# Patient Record
Sex: Male | Born: 1935 | Race: White | Hispanic: No | Marital: Married | Smoking: Former smoker
Health system: Southern US, Community
[De-identification: ages and names within clinical notes are randomized; demographics above are authoritative.]

## PROBLEM LIST (undated history)

## (undated) DIAGNOSIS — IMO0001 Reserved for inherently not codable concepts without codable children: Secondary | ICD-10-CM

## (undated) DIAGNOSIS — I2699 Other pulmonary embolism without acute cor pulmonale: Secondary | ICD-10-CM

## (undated) DIAGNOSIS — K219 Gastro-esophageal reflux disease without esophagitis: Secondary | ICD-10-CM

## (undated) DIAGNOSIS — E7800 Pure hypercholesterolemia, unspecified: Secondary | ICD-10-CM

---

## 2005-05-05 ENCOUNTER — Ambulatory Visit (HOSPITAL_COMMUNITY): Admission: RE | Admit: 2005-05-05 | Discharge: 2005-05-05 | Payer: Self-pay | Admitting: Family Medicine

## 2014-03-29 DIAGNOSIS — Z125 Encounter for screening for malignant neoplasm of prostate: Secondary | ICD-10-CM | POA: Diagnosis not present

## 2014-03-29 DIAGNOSIS — E785 Hyperlipidemia, unspecified: Secondary | ICD-10-CM | POA: Diagnosis not present

## 2014-03-29 DIAGNOSIS — Z0000 Encounter for general adult medical examination without abnormal findings: Secondary | ICD-10-CM | POA: Diagnosis not present

## 2014-03-29 DIAGNOSIS — Z79899 Other long term (current) drug therapy: Secondary | ICD-10-CM | POA: Diagnosis not present

## 2014-08-13 DIAGNOSIS — D235 Other benign neoplasm of skin of trunk: Secondary | ICD-10-CM | POA: Diagnosis not present

## 2014-08-13 DIAGNOSIS — L820 Inflamed seborrheic keratosis: Secondary | ICD-10-CM | POA: Diagnosis not present

## 2014-08-13 DIAGNOSIS — L570 Actinic keratosis: Secondary | ICD-10-CM | POA: Diagnosis not present

## 2014-10-03 DIAGNOSIS — Z23 Encounter for immunization: Secondary | ICD-10-CM | POA: Diagnosis not present

## 2014-10-10 DIAGNOSIS — Z23 Encounter for immunization: Secondary | ICD-10-CM | POA: Diagnosis not present

## 2014-10-10 DIAGNOSIS — E785 Hyperlipidemia, unspecified: Secondary | ICD-10-CM | POA: Diagnosis not present

## 2015-04-04 DIAGNOSIS — Z79899 Other long term (current) drug therapy: Secondary | ICD-10-CM | POA: Diagnosis not present

## 2015-04-04 DIAGNOSIS — E785 Hyperlipidemia, unspecified: Secondary | ICD-10-CM | POA: Diagnosis not present

## 2015-04-04 DIAGNOSIS — Z125 Encounter for screening for malignant neoplasm of prostate: Secondary | ICD-10-CM | POA: Diagnosis not present

## 2015-04-04 DIAGNOSIS — Z0000 Encounter for general adult medical examination without abnormal findings: Secondary | ICD-10-CM | POA: Diagnosis not present

## 2015-10-01 DIAGNOSIS — Z23 Encounter for immunization: Secondary | ICD-10-CM | POA: Diagnosis not present

## 2015-10-11 DIAGNOSIS — E785 Hyperlipidemia, unspecified: Secondary | ICD-10-CM | POA: Diagnosis not present

## 2015-10-11 DIAGNOSIS — Z79899 Other long term (current) drug therapy: Secondary | ICD-10-CM | POA: Diagnosis not present

## 2015-12-17 ENCOUNTER — Emergency Department (HOSPITAL_COMMUNITY): Payer: Medicare Other

## 2015-12-17 ENCOUNTER — Observation Stay (HOSPITAL_BASED_OUTPATIENT_CLINIC_OR_DEPARTMENT_OTHER): Payer: Medicare Other

## 2015-12-17 ENCOUNTER — Encounter (HOSPITAL_COMMUNITY): Payer: Self-pay | Admitting: *Deleted

## 2015-12-17 ENCOUNTER — Observation Stay (HOSPITAL_COMMUNITY): Payer: Medicare Other

## 2015-12-17 ENCOUNTER — Inpatient Hospital Stay (HOSPITAL_COMMUNITY)
Admission: EM | Admit: 2015-12-17 | Discharge: 2015-12-23 | DRG: 176 | Disposition: A | Discharge status: Home / Self Care | Payer: Medicare Other | Attending: Family Medicine | Admitting: Family Medicine

## 2015-12-17 DIAGNOSIS — I10 Essential (primary) hypertension: Secondary | ICD-10-CM | POA: Diagnosis present

## 2015-12-17 DIAGNOSIS — E860 Dehydration: Secondary | ICD-10-CM | POA: Diagnosis not present

## 2015-12-17 DIAGNOSIS — R0600 Dyspnea, unspecified: Secondary | ICD-10-CM

## 2015-12-17 DIAGNOSIS — R0789 Other chest pain: Secondary | ICD-10-CM | POA: Diagnosis not present

## 2015-12-17 DIAGNOSIS — I2601 Septic pulmonary embolism with acute cor pulmonale: Secondary | ICD-10-CM

## 2015-12-17 DIAGNOSIS — Z880 Allergy status to penicillin: Secondary | ICD-10-CM

## 2015-12-17 DIAGNOSIS — E785 Hyperlipidemia, unspecified: Secondary | ICD-10-CM | POA: Diagnosis not present

## 2015-12-17 DIAGNOSIS — K2100 Gastro-esophageal reflux disease with esophagitis, without bleeding: Secondary | ICD-10-CM

## 2015-12-17 DIAGNOSIS — I82402 Acute embolism and thrombosis of unspecified deep veins of left lower extremity: Secondary | ICD-10-CM | POA: Diagnosis not present

## 2015-12-17 DIAGNOSIS — Z87891 Personal history of nicotine dependence: Secondary | ICD-10-CM | POA: Diagnosis not present

## 2015-12-17 DIAGNOSIS — I2699 Other pulmonary embolism without acute cor pulmonale: Secondary | ICD-10-CM | POA: Diagnosis not present

## 2015-12-17 DIAGNOSIS — N289 Disorder of kidney and ureter, unspecified: Secondary | ICD-10-CM

## 2015-12-17 DIAGNOSIS — R0602 Shortness of breath: Secondary | ICD-10-CM | POA: Diagnosis present

## 2015-12-17 DIAGNOSIS — R079 Chest pain, unspecified: Secondary | ICD-10-CM | POA: Diagnosis not present

## 2015-12-17 DIAGNOSIS — Z91030 Bee allergy status: Secondary | ICD-10-CM

## 2015-12-17 DIAGNOSIS — I2692 Saddle embolus of pulmonary artery without acute cor pulmonale: Secondary | ICD-10-CM | POA: Diagnosis not present

## 2015-12-17 DIAGNOSIS — K219 Gastro-esophageal reflux disease without esophagitis: Secondary | ICD-10-CM | POA: Diagnosis present

## 2015-12-17 DIAGNOSIS — R55 Syncope and collapse: Secondary | ICD-10-CM

## 2015-12-17 DIAGNOSIS — S0990XA Unspecified injury of head, initial encounter: Secondary | ICD-10-CM

## 2015-12-17 DIAGNOSIS — E7800 Pure hypercholesterolemia, unspecified: Secondary | ICD-10-CM | POA: Diagnosis present

## 2015-12-17 HISTORY — DX: Gastro-esophageal reflux disease without esophagitis: K21.9

## 2015-12-17 HISTORY — DX: Reserved for inherently not codable concepts without codable children: IMO0001

## 2015-12-17 HISTORY — DX: Pure hypercholesterolemia, unspecified: E78.00

## 2015-12-17 LAB — COMPREHENSIVE METABOLIC PANEL
ALBUMIN: 3.3 g/dL — AB (ref 3.5–5.0)
ALK PHOS: 66 U/L (ref 38–126)
ALT: 19 U/L (ref 17–63)
ANION GAP: 9 (ref 5–15)
AST: 21 U/L (ref 15–41)
BUN: 19 mg/dL (ref 6–20)
CALCIUM: 9.1 mg/dL (ref 8.9–10.3)
CHLORIDE: 105 mmol/L (ref 101–111)
CO2: 24 mmol/L (ref 22–32)
Creatinine, Ser: 1.4 mg/dL — ABNORMAL HIGH (ref 0.61–1.24)
GFR calc Af Amer: 54 mL/min — ABNORMAL LOW (ref >60)
GFR calc non Af Amer: 47 mL/min — ABNORMAL LOW (ref >60)
GLUCOSE: 171 mg/dL — AB (ref 65–99)
POTASSIUM: 3.9 mmol/L (ref 3.5–5.1)
SODIUM: 138 mmol/L (ref 135–145)
Total Bilirubin: 0.6 mg/dL (ref 0.3–1.2)
Total Protein: 7.2 g/dL (ref 6.5–8.1)

## 2015-12-17 LAB — CBC WITH DIFFERENTIAL/PLATELET
BASOS PCT: 0 %
Basophils Absolute: 0 10*3/uL (ref 0.0–0.1)
EOS ABS: 0.3 10*3/uL (ref 0.0–0.7)
EOS PCT: 3 %
HCT: 41.9 % (ref 39.0–52.0)
HEMOGLOBIN: 14.2 g/dL (ref 13.0–17.0)
Lymphocytes Relative: 17 %
Lymphs Abs: 2 10*3/uL (ref 0.7–4.0)
MCH: 30.6 pg (ref 26.0–34.0)
MCHC: 33.9 g/dL (ref 30.0–36.0)
MCV: 90.3 fL (ref 78.0–100.0)
MONOS PCT: 5 %
Monocytes Absolute: 0.6 10*3/uL (ref 0.1–1.0)
NEUTROS PCT: 75 %
Neutro Abs: 8.7 10*3/uL — ABNORMAL HIGH (ref 1.7–7.7)
PLATELETS: 144 10*3/uL — AB (ref 150–400)
RBC: 4.64 MIL/uL (ref 4.22–5.81)
RDW: 12.9 % (ref 11.5–15.5)
WBC: 11.6 10*3/uL — AB (ref 4.0–10.5)

## 2015-12-17 LAB — URINE MICROSCOPIC-ADD ON

## 2015-12-17 LAB — URINALYSIS, ROUTINE W REFLEX MICROSCOPIC
Bilirubin Urine: NEGATIVE
Glucose, UA: NEGATIVE mg/dL
Ketones, ur: NEGATIVE mg/dL
LEUKOCYTES UA: NEGATIVE
NITRITE: NEGATIVE
PH: 5.5 (ref 5.0–8.0)
Protein, ur: NEGATIVE mg/dL
SPECIFIC GRAVITY, URINE: 1.02 (ref 1.005–1.030)

## 2015-12-17 LAB — TROPONIN I
TROPONIN I: 0.05 ng/mL — AB (ref <0.031)
TROPONIN I: 0.29 ng/mL — AB (ref <0.031)
TROPONIN I: 0.39 ng/mL — AB (ref <0.031)

## 2015-12-17 LAB — HEPARIN LEVEL (UNFRACTIONATED): Heparin Unfractionated: 1.02 IU/mL — ABNORMAL HIGH (ref 0.30–0.70)

## 2015-12-17 LAB — TSH: TSH: 2.442 u[IU]/mL (ref 0.350–4.500)

## 2015-12-17 LAB — LIPASE, BLOOD: Lipase: 58 U/L — ABNORMAL HIGH (ref 11–51)

## 2015-12-17 LAB — D-DIMER, QUANTITATIVE (NOT AT ARMC): D DIMER QUANT: 16 ug{FEU}/mL — AB (ref 0.00–0.50)

## 2015-12-17 MED ORDER — SODIUM CHLORIDE 0.9 % IV BOLUS (SEPSIS)
500.0000 mL | Freq: Once | INTRAVENOUS | Status: AC
  Administered 2015-12-17: 500 mL via INTRAVENOUS

## 2015-12-17 MED ORDER — IOHEXOL 350 MG/ML SOLN
100.0000 mL | Freq: Once | INTRAVENOUS | Status: AC | PRN
Start: 2015-12-17 — End: 2015-12-17
  Administered 2015-12-17: 100 mL via INTRAVENOUS

## 2015-12-17 MED ORDER — ACETAMINOPHEN 650 MG RE SUPP: 650.0000 mg | Freq: Four times a day (QID) | RECTAL | Status: DC | PRN

## 2015-12-17 MED ORDER — NITROGLYCERIN 0.4 MG SL SUBL
0.4000 mg | SUBLINGUAL_TABLET | SUBLINGUAL | Status: DC | PRN
  Administered 2015-12-17: 0.4 mg via SUBLINGUAL
  Filled 2015-12-17: qty 1

## 2015-12-17 MED ORDER — ONDANSETRON HCL 4 MG PO TABS
4.0000 mg | ORAL_TABLET | Freq: Four times a day (QID) | ORAL | Status: DC | PRN
  Administered 2015-12-17: 4 mg via ORAL
  Filled 2015-12-17: qty 1

## 2015-12-17 MED ORDER — SIMVASTATIN 20 MG PO TABS
40.0000 mg | ORAL_TABLET | Freq: Every day | ORAL | Status: DC
  Administered 2015-12-17 – 2015-12-22 (×6): 40 mg via ORAL
  Filled 2015-12-17 (×7): qty 2

## 2015-12-17 MED ORDER — ACETAMINOPHEN 325 MG PO TABS: 650.0000 mg | ORAL_TABLET | Freq: Four times a day (QID) | ORAL | Status: DC | PRN

## 2015-12-17 MED ORDER — HEPARIN BOLUS VIA INFUSION
3000.0000 [IU] | Freq: Once | INTRAVENOUS | Status: AC
  Administered 2015-12-17: 3000 [IU] via INTRAVENOUS
  Filled 2015-12-17: qty 3000

## 2015-12-17 MED ORDER — ENOXAPARIN SODIUM 40 MG/0.4ML ~~LOC~~ SOLN
40.0000 mg | SUBCUTANEOUS | Status: DC
  Administered 2015-12-17: 40 mg via SUBCUTANEOUS
  Filled 2015-12-17: qty 0.4

## 2015-12-17 MED ORDER — HEPARIN (PORCINE) IN NACL 100-0.45 UNIT/ML-% IJ SOLN
900.0000 [IU]/h | INTRAMUSCULAR | Status: DC
  Administered 2015-12-17: 1250 [IU]/h via INTRAVENOUS
  Administered 2015-12-19 – 2015-12-21 (×3): 1050 [IU]/h via INTRAVENOUS
  Filled 2015-12-17 (×5): qty 250

## 2015-12-17 MED ORDER — ONDANSETRON HCL 4 MG/2ML IJ SOLN: 4.0000 mg | Freq: Four times a day (QID) | INTRAMUSCULAR | Status: DC | PRN

## 2015-12-17 MED ORDER — SODIUM CHLORIDE 0.9 % IJ SOLN
3.0000 mL | Freq: Two times a day (BID) | INTRAMUSCULAR | Status: DC
  Administered 2015-12-18 – 2015-12-19 (×2): 3 mL via INTRAVENOUS

## 2015-12-17 MED ORDER — ASPIRIN EC 325 MG PO TBEC
325.0000 mg | DELAYED_RELEASE_TABLET | Freq: Every day | ORAL | Status: DC
  Administered 2015-12-18 – 2015-12-23 (×6): 325 mg via ORAL
  Filled 2015-12-17 (×6): qty 1

## 2015-12-17 MED ORDER — SODIUM CHLORIDE 0.9 % IV SOLN
INTRAVENOUS | Status: DC
  Administered 2015-12-17 – 2015-12-20 (×4): via INTRAVENOUS
  Administered 2015-12-20 – 2015-12-21 (×2): 1 mL via INTRAVENOUS
  Administered 2015-12-21 – 2015-12-22 (×3): via INTRAVENOUS

## 2015-12-17 MED ORDER — ONDANSETRON HCL 4 MG/2ML IJ SOLN
4.0000 mg | Freq: Once | INTRAMUSCULAR | Status: AC
  Administered 2015-12-17: 4 mg via INTRAVENOUS
  Filled 2015-12-17: qty 2

## 2015-12-17 MED ORDER — FAMOTIDINE 20 MG PO TABS
20.0000 mg | ORAL_TABLET | Freq: Two times a day (BID) | ORAL | Status: DC
  Administered 2015-12-17 – 2015-12-23 (×12): 20 mg via ORAL
  Filled 2015-12-17 (×12): qty 1

## 2015-12-17 MED ORDER — PANTOPRAZOLE SODIUM 40 MG PO TBEC
40.0000 mg | DELAYED_RELEASE_TABLET | Freq: Every day | ORAL | Status: DC
  Administered 2015-12-17 – 2015-12-23 (×7): 40 mg via ORAL
  Filled 2015-12-17 (×7): qty 1

## 2015-12-17 NOTE — Progress Notes (Signed)
ANTICOAGULATION CONSULT NOTE - Initial Consult  Pharmacy Consult for heparin Indication: pulmonary embolus  Allergies  Allergen Reactions  . Bee Venom   . Penicillins Rash    Patient Measurements: Height: 5\' 7"  (170.2 cm) Weight: 177 lb 12.8 oz (80.65 kg) IBW/kg (Calculated) : 66.1 Heparin Dosing weight: 80   Vital Signs: Temp: 97.9 F (36.6 C) (12/20 1301) Temp Source: Oral (12/20 1301) BP: 153/85 mmHg (12/20 1301) Pulse Rate: 116 (12/20 1301)  Labs:  Recent Labs  12/17/15 0956 12/17/15 1337  HGB 14.2  --   HCT 41.9  --   PLT 144*  --   CREATININE 1.40*  --   TROPONINI 0.05* 0.29*    Estimated Creatinine Clearance: 44.2 mL/min (by C-G formula based on Cr of 1.4).   Medical History: Past Medical History  Diagnosis Date  . High cholesterol   . Reflux     Medications:  See medication history  Assessment: 80 yo man with LOC earlier today to start heparin for PE.  He received a dose of lovenox earlier today. Goal of Therapy:  Heparin level 0.3-0.7 units/ml Monitor platelets by anticoagulation protocol: Yes   Plan:  Heparin bolus 3000 units (slighlty lower due to previous lovenox dose) and drip at 1250 units/hr Check heparin level ~6-8 hours after start and daily while on heparin. Monitor for bleeding complications  Thanks for allowing pharmacy to be a part of this patient's care.  Excell Seltzer, PharmD Clinical Pharmacist 12/17/2015,4:56 PM

## 2015-12-17 NOTE — Progress Notes (Signed)
Called by radiologist to discuss the CT Angio report. Shows positive for acute PE with CT evidence of right heart strain consistent with at least submassive (intermediate risk) PE.  Called and discussed with pulmonologist on call Dr. Chase Caller at [facility]. He reviewed the CT scan images, and discussed the case in detail. He recommended to see the patient, and check vital signs, including blood pressure, respiratory rate, heart rate. If worsening of vital signs, patient can be transferred to [facility] otherwise will continue with IV heparin at least for 5 more days, and closely monitor the patient in stepdown unit.  I personally examined the patient, at this time patient denies dyspnea. Blood pressure 153/85, pulse 116/min, respiration 15-18 breaths per minute.  On exam- chest is clear to auscultation bilaterally  Assessment Submassive pulmonary embolism  Plan Continue IV heparin for 5 more days. Will transfer to stepdown for closer monitoring. If patient clinical condition deteriorates including hypotension, tachypnea and tachycardia patient needs to be transferred to [facility] for intravenous thrombolysis. Plan discussed with Dr. Chase Caller.

## 2015-12-17 NOTE — H&P (Signed)
[facility] History and Physical  DAYRON DIETERT [id number] DOB: 1935-11-11 DOA: 12/17/2015  Referring physician: Julianne Rice, MD PCP: No PCP Per Patient   Chief Complaint: Chest pain and loss of consciousness   HPI: Jacob Bauer is a 80 y.o. male with PMH of GERD and HLD presented with complaints of cheat pain and LOC onset this morning. Chest pain was reported to be before the LOC episodes. Pain is described as gas/pressure and is located on the left side of the chest and does not radiate. Family reports that while sitting in a recliner she noticed his eyes roll in his head and he slumped over for a short time. He seemed to recover from this episode and felt no need for medical intervention. A short while later while sitting on the edge of the bed it is reported that he experienced an unwitnessed LOC. Wife reports hearing his fall and noticing his head wound when she approached him. He was unresponsive during the episode and no jerking movements were noted. Once he recovered there was mild slurring that resolved within minutes. He does not recall any of the events during the LOC. He reports having what they felt to be a cold one week ago consisting of chest and body aches, shortness of breath and coughing. The chest pain this morning is different from the chest pain from the week prior.  Admits mild diarrhea and diaphoresis. Denies fever, history of cardiac issues, vomiting, nausea, or joint pain .  While in the ED patient found to have nonspecific ST changes in his EKG and troponin of 0.05. He reports resolution of chest pain with NTG. He is being admitted for further observations.   Review of Systems:  Constitutional:  No weight loss, night sweats, Fevers, chills, fatigue.  HEENT:  No headaches, Difficulty swallowing,Tooth/dental problems,Sore throat,  No sneezing, itching, ear ache, nasal congestion, post nasal drip,  Cardio-vascular:  No Orthopnea, PND, swelling in lower  extremities, anasarca, dizziness, palpitations  Positive: chest pain GI:  No heartburn, indigestion, abdominal pain, nausea, vomiting, , change in bowel habits, loss of appetite  Positive: diarrhea Resp:  No shortness of breath with exertion or at rest. No excess mucus, no productive cough, No non-productive cough, No coughing up of blood.No change in color of mucus.No wheezing.No chest wall deformity  Skin: no rash   Positive for abrasion on forehead.  GU:  no dysuria, change in color of urine, no urgency or frequency. No flank pain.  Musculoskeletal:  No joint pain or swelling. No decreased range of motion. No back pain.  Psych:  No change in mood or affect. No depression or anxiety. No memory loss.   Past Medical History  Diagnosis Date  . High cholesterol   . Reflux    History reviewed. No pertinent past surgical history. Social History:  reports that he has quit smoking. His smoking use included Cigarettes. He quit after 10 years of use. He does not have any smokeless tobacco history on file. He reports that he does not drink alcohol or use illicit drugs.  Allergies  Allergen Reactions  . Bee Venom   . Penicillins Rash    Father had heart disease.  Prior to Admission medications   Medication Sig Start Date End Date Taking? Authorizing Provider  acetaminophen (TYLENOL) 325 MG tablet Take 650 mg by mouth every 6 (six) hours as needed.   Yes Historical Provider, MD  cetirizine (ZYRTEC ALLERGY) 10 MG tablet Take 10 mg by mouth daily.  Yes Historical Provider, MD  Coenzyme Q10 (CO Q-10) 100 MG CAPS Take 1 tablet by mouth daily.   Yes Historical Provider, MD  guaiFENesin-dextromethorphan (ROBITUSSIN DM) 100-10 MG/5ML syrup Take 15 mLs by mouth every 4 (four) hours as needed for cough.   Yes Historical Provider, MD  Multiple Vitamins-Minerals (CENTRUM ADULTS) TABS Take 1 tablet by mouth daily.   Yes Historical Provider, MD  Multiple Vitamins-Minerals (MULTIVITAMIN WITH  IRON-MINERALS) liquid Take by mouth daily.   Yes Historical Provider, MD  ranitidine (ZANTAC) 150 MG tablet Take 150 mg by mouth 2 (two) times daily.   Yes Historical Provider, MD  simvastatin (ZOCOR) 40 MG tablet Take 40 mg by mouth daily.   Yes Historical Provider, MD   Physical Exam: Filed Vitals:   12/17/15 1030 12/17/15 1100 12/17/15 1130 12/17/15 1200  BP: 143/80 145/87 150/80 135/93  Pulse: 111 110  111  Temp:      TempSrc:      Resp: 19 24  22   Height:      Weight:      SpO2: 97% 94%  97%    Wt Readings from Last 3 Encounters:  12/17/15 83.915 kg (185 lb)    General:  Appears calm and comfortable Eyes: PERRL, normal lids, irises & conjunctiva ENT: grossly normal hearing, lips & tongue Neck: no LAD, masses or thyromegaly Cardiovascular: RRR, no m/r/g. No LE edema. Telemetry: SR, no arrhythmias  Respiratory: CTA bilaterally, no w/r/r. Normal respiratory effort. Abdomen: soft, ntnd Skin: Abrasions noted on forehead Musculoskeletal: grossly normal tone BUE/BLE Psychiatric: grossly normal mood and affect, speech fluent and appropriate Neurologic: grossly non-focal.          Labs on Admission:  Basic Metabolic Panel:  Recent Labs Lab 12/17/15 0956  NA 138  K 3.9  CL 105  CO2 24  GLUCOSE 171*  BUN 19  CREATININE 1.40*  CALCIUM 9.1   Liver Function Tests:  Recent Labs Lab 12/17/15 0956  AST 21  ALT 19  ALKPHOS 66  BILITOT 0.6  PROT 7.2  ALBUMIN 3.3*    Recent Labs Lab 12/17/15 0956  LIPASE 58*  CBC:  Recent Labs Lab 12/17/15 0956  WBC 11.6*  NEUTROABS 8.7*  HGB 14.2  HCT 41.9  MCV 90.3  PLT 144*   Cardiac Enzymes:  Recent Labs Lab 12/17/15 0956  TROPONINI 0.05*    Radiological Exams on Admission: Ct Head Wo Contrast  12/17/2015  CLINICAL DATA:  Syncope this morning with a blow to the forehead. Initial encounter. EXAM: CT HEAD WITHOUT CONTRAST TECHNIQUE: Contiguous axial images were obtained from the base of the skull through  the vertex without intravenous contrast. COMPARISON:  None. FINDINGS: The brain is atrophic with extensive chronic microvascular ischemic change. No evidence of acute intracranial abnormality including hemorrhage, infarct, mass lesion, mass effect, midline shift or abnormal extra-axial fluid collection is seen. There is no hydrocephalus or pneumocephalus. The calvarium is intact. Imaged paranasal sinuses and mastoid air cells are clear. IMPRESSION: No acute abnormality. Electronically Signed   By: Inge Rise M.D.   On: 12/17/2015 11:18   Dg Chest Port 1 View  12/17/2015  CLINICAL DATA:  Left chest pain EXAM: PORTABLE CHEST 1 VIEW COMPARISON:  05/05/2005 FINDINGS: Heart size is upper normal.  Negative for heart failure. Lungs are clear without infiltrate or effusion. Negative for mass lesion. No interval change. IMPRESSION: No active disease. Electronically Signed   By: Franchot Gallo M.D.   On: 12/17/2015 10:09    EKG:  Independently reviewed. Nonspecific ST changes   Assessment/Plan Active Problems:   Chest pain   Syncope and collapse   HLD (hyperlipidemia)   GERD (gastroesophageal reflux disease)   1. Chest pain, appears to be atypical. Will monitor on telemetry and cycle cardiac markers. Patient denies any history of MI or cardiac workup. Will request cardiology consult to see if any stress test or further workup is needed.  2. Syncope and collapse, orthostatics found to be negative. CT head did not show any acute findings. No convincing evidence of seizure activity. Continue to monitor on telemetry for any arrhthymias. Check D-dimer, UA, and ECHO. Provide gentle hydration for any element of dehydration 3. HLD, continue statin  4. GERD, continue PPI.   Code Status: Full DVT Prophylaxis:SCDs Family Communication: Family bedside  Disposition Plan: Admit for observation   Time spent: 51 minutes  Kathie Dike, MD [facility] Pager [phone]   By signing my name below,  I, Rennis Harding, attest that this documentation has been prepared under the direction and in the presence of Kathie Dike, MD. Electronically signed: Rennis Harding, Scribe.12/17/2015 12:00pm  I, Dr. Kathie Dike, personally performed the services described in this documentaiton. All medical record entries made by the scribe were at my direction and in my presence. I have reviewed the chart and agree that the record reflects my personal performance and is accurate and complete  Kathie Dike, MD, 12/17/2015 12:55 PM

## 2015-12-17 NOTE — ED Notes (Addendum)
Pt passed out this am, hitting forehead.  Pt do not remember what happened.  Wife gave pt two 325 mg ASA.  CBG with EMS 130.  Orthostatic BP's negative per EMS on site.

## 2015-12-17 NOTE — ED Provider Notes (Signed)
CSN: [id number]     Arrival date & time 12/17/15  [id number] History  By signing my name below, I, Tula Nakayama, attest that this documentation has been prepared under the direction and in the presence of Julianne Rice, MD.  Electronically Signed: Tula Nakayama, ED Scribe. 12/17/2015. 9:56 AM.  Chief Complaint  Patient presents with  . Loss of Consciousness   The history is provided by the patient. No language interpreter was used.    HPI Comments: Jacob Bauer is a 80 y.o. male brought in by ambulance, with a history of GERD, who presents to the Emergency Department s/p mechanical fall and brief LOC at home PTA. Pt reports gradual onset, mild left-sided CP (described as "gas pains") that started prior to fall and anterior head pain, with an associated abrasion, that occurred with the fall. Pt states feeling generally weak and light-headed while sitting in bed just prior to fall. He also notes 1 episode of diarrhea this morning. Pt's wife administered 2 Aspirin-325 mg PTA. Pt reports that onset of CP started after he drank coffee. He denies a history of cardiac problems. Pt states he had left-sided CP with associated difficulty breathing 2 weeks ago, which resolved. He suspected pain was caused by a viral infection and was not evaluated for his symptoms. Pt denies nausea, tunnel vision, difficulty breathing, lower extremity swelling or pain and vomiting.  Past Medical History  Diagnosis Date  . High cholesterol   . Reflux    History reviewed. No pertinent past surgical history. History reviewed. No pertinent family history. Social History  Substance Use Topics  . Smoking status: Former Smoker -- 10 years    Types: Cigarettes  . Smokeless tobacco: None  . Alcohol Use: No    Review of Systems  Constitutional: Positive for fatigue. Negative for fever and chills.  Eyes: Negative for visual disturbance.  Respiratory: Positive for shortness of breath. Negative for cough and wheezing.    Cardiovascular: Positive for chest pain and syncope. Negative for palpitations and leg swelling.  Gastrointestinal: Positive for diarrhea. Negative for nausea, vomiting and abdominal pain.  Genitourinary: Negative for dysuria, flank pain and difficulty urinating.  Musculoskeletal: Negative for back pain, neck pain and neck stiffness.  Skin: Positive for wound. Negative for rash.  Neurological: Positive for dizziness, syncope, light-headedness and headaches. Negative for weakness and numbness.  All other systems reviewed and are negative.     Allergies  Bee venom and Penicillins  Home Medications   Prior to Admission medications   Medication Sig Start Date End Date Taking? Authorizing Provider  acetaminophen (TYLENOL) 325 MG tablet Take 650 mg by mouth every 6 (six) hours as needed.   Yes Historical Provider, MD  cetirizine (ZYRTEC ALLERGY) 10 MG tablet Take 10 mg by mouth daily.   Yes Historical Provider, MD  Coenzyme Q10 (CO Q-10) 100 MG CAPS Take 1 tablet by mouth daily.   Yes Historical Provider, MD  guaiFENesin-dextromethorphan (ROBITUSSIN DM) 100-10 MG/5ML syrup Take 15 mLs by mouth every 4 (four) hours as needed for cough.   Yes Historical Provider, MD  Multiple Vitamins-Minerals (CENTRUM ADULTS) TABS Take 1 tablet by mouth daily.   Yes Historical Provider, MD  Multiple Vitamins-Minerals (MULTIVITAMIN WITH IRON-MINERALS) liquid Take by mouth daily.   Yes Historical Provider, MD  ranitidine (ZANTAC) 150 MG tablet Take 150 mg by mouth 2 (two) times daily.   Yes Historical Provider, MD  simvastatin (ZOCOR) 40 MG tablet Take 40 mg by mouth daily.   Yes  Historical Provider, MD   BP 150/80 mmHg  Pulse 110  Temp(Src) 98.2 F (36.8 C) (Oral)  Resp 24  Ht 5\' 7"  (1.702 m)  Wt 185 lb (83.915 kg)  BMI 28.97 kg/m2  SpO2 94% Physical Exam  Constitutional: He is oriented to person, place, and time. He appears well-developed and well-nourished. No distress.  HENT:  Head: Normocephalic.   Mouth/Throat: Oropharynx is clear and moist. No oropharyngeal exudate.  Forehead abrasion without underlying bony deformity. Midface is stable.  Eyes: EOM are normal. Pupils are equal, round, and reactive to light.  Neck: Normal range of motion. Neck supple.  No posterior midline cervical tenderness to palpation.  Cardiovascular: Normal rate and regular rhythm.  Exam reveals no gallop and no friction rub.   No murmur heard. Pulmonary/Chest: Effort normal and breath sounds normal. No respiratory distress. He has no wheezes. He has no rales. He exhibits no tenderness.  Abdominal: Soft. Bowel sounds are normal. He exhibits no distension and no mass. There is no tenderness. There is no rebound and no guarding.  Musculoskeletal: Normal range of motion. He exhibits no edema or tenderness.  No lower extremity swelling or pain. Distal pulses intact.  Neurological: He is alert and oriented to person, place, and time.  Patient is alert and oriented x3 with clear, goal oriented speech. Patient has 5/5 motor in all extremities. Sensation is intact to light touch. Bilateral finger-to-nose is normal with no signs of dysmetria.   Skin: Skin is warm and dry. No rash noted. No erythema.  Psychiatric: He has a normal mood and affect. His behavior is normal.  Nursing note and vitals reviewed.   ED Course  Procedures  DIAGNOSTIC STUDIES: Oxygen Saturation is 95% on RA, adequate by my interpretation.    COORDINATION OF CARE: 9:56 AM Discussed treatment plan with pt at bedside and pt agreed to plan.  Labs Review Labs Reviewed  CBC WITH DIFFERENTIAL/PLATELET - Abnormal; Notable for the following:    WBC 11.6 (*)    Platelets 144 (*)    Neutro Abs 8.7 (*)    All other components within normal limits  COMPREHENSIVE METABOLIC PANEL - Abnormal; Notable for the following:    Glucose, Bld 171 (*)    Creatinine, Ser 1.40 (*)    Albumin 3.3 (*)    GFR calc non Af Amer 47 (*)    GFR calc Af Amer 54 (*)     All other components within normal limits  LIPASE, BLOOD - Abnormal; Notable for the following:    Lipase 58 (*)    All other components within normal limits  TROPONIN I - Abnormal; Notable for the following:    Troponin I 0.05 (*)    All other components within normal limits  URINALYSIS, ROUTINE W REFLEX MICROSCOPIC (NOT AT [facility])    Imaging Review Ct Head Wo Contrast  12/17/2015  CLINICAL DATA:  Syncope this morning with a blow to the forehead. Initial encounter. EXAM: CT HEAD WITHOUT CONTRAST TECHNIQUE: Contiguous axial images were obtained from the base of the skull through the vertex without intravenous contrast. COMPARISON:  None. FINDINGS: The brain is atrophic with extensive chronic microvascular ischemic change. No evidence of acute intracranial abnormality including hemorrhage, infarct, mass lesion, mass effect, midline shift or abnormal extra-axial fluid collection is seen. There is no hydrocephalus or pneumocephalus. The calvarium is intact. Imaged paranasal sinuses and mastoid air cells are clear. IMPRESSION: No acute abnormality. Electronically Signed   By: Inge Rise M.D.  On: 12/17/2015 11:18   Dg Chest Port 1 View  12/17/2015  CLINICAL DATA:  Left chest pain EXAM: PORTABLE CHEST 1 VIEW COMPARISON:  05/05/2005 FINDINGS: Heart size is upper normal.  Negative for heart failure. Lungs are clear without infiltrate or effusion. Negative for mass lesion. No interval change. IMPRESSION: No active disease. Electronically Signed   By: Franchot Gallo M.D.   On: 12/17/2015 10:09   I have personally reviewed and evaluated these images and lab results as part of my medical decision-making.   EKG Interpretation   Date/Time:  Tuesday December 17 2015 09:32:54 EST Ventricular Rate:  110 PR Interval:  177 QRS Duration: 82 QT Interval:  329 QTC Calculation: 445 R Axis:   94 Text Interpretation:  Sinus tachycardia Consider left atrial enlargement  Right axis deviation Borderline  repolarization abnormality Confirmed by  Yitzel Shasteen  MD, Romell Cavanah ([id number]) on 12/17/2015 12:06:59 PM      MDM   Final diagnoses:  Chest pain, unspecified chest pain type  Syncope and collapse  Closed head injury, initial encounter    I personally performed the services described in this documentation, which was scribed in my presence. The recorded information has been reviewed and is accurate.   Patient's chest pain is completely resolved with nitroglycerin. No old EKG for comparison but concern for ischemic changes. Discuss with hospitalist Dr. Roderic Palau. Will admit to observation telemetry bed.   Julianne Rice, MD 12/17/15 [phone]

## 2015-12-17 NOTE — Progress Notes (Signed)
Call from  [facility] about submassive PE - code PE dsicussion  PESI score appears class 4 - 108 based on age 80, male and HR > 110 but normoxic, not confused and normothermic    Recent Labs Lab 12/17/15 0956 12/17/15 1337  TROPONINI 0.05* 0.29*    No results for input(s): LATICACIDVEN, PROCALCITON in the last 168 hours.  ECHO - only mild RV strain  A Class 4 PESI - PE  p IV heaparin for 5 days before oral agent - due to risk of decline being greatest next few days If declines in any manner such as trachpnea, hypoxemia, low bp- consider EKOS v systemic TPA - no contraindication noticed per Dr Roderic Palau   Dr. Brand Males, M.D., [facility].C.P Pulmonary and Critical Care Medicine Staff Physician [facility] Pager: [phone], If no answer or between  15:00h - 7:00h: call [phone]  12/17/2015 8:30 PM

## 2015-12-17 NOTE — ED Notes (Signed)
Hospitalist at bedside 

## 2015-12-17 NOTE — ED Notes (Signed)
Pt continues to be unable to give urine sample.

## 2015-12-18 ENCOUNTER — Observation Stay (HOSPITAL_COMMUNITY): Payer: Medicare Other

## 2015-12-18 DIAGNOSIS — I2699 Other pulmonary embolism without acute cor pulmonale: Secondary | ICD-10-CM | POA: Diagnosis present

## 2015-12-18 DIAGNOSIS — I2692 Saddle embolus of pulmonary artery without acute cor pulmonale: Secondary | ICD-10-CM | POA: Diagnosis present

## 2015-12-18 DIAGNOSIS — Z87891 Personal history of nicotine dependence: Secondary | ICD-10-CM | POA: Diagnosis not present

## 2015-12-18 DIAGNOSIS — R55 Syncope and collapse: Secondary | ICD-10-CM

## 2015-12-18 DIAGNOSIS — N289 Disorder of kidney and ureter, unspecified: Secondary | ICD-10-CM

## 2015-12-18 DIAGNOSIS — E785 Hyperlipidemia, unspecified: Secondary | ICD-10-CM | POA: Diagnosis present

## 2015-12-18 DIAGNOSIS — I82402 Acute embolism and thrombosis of unspecified deep veins of left lower extremity: Secondary | ICD-10-CM | POA: Diagnosis present

## 2015-12-18 DIAGNOSIS — I2609 Other pulmonary embolism with acute cor pulmonale: Secondary | ICD-10-CM | POA: Diagnosis not present

## 2015-12-18 DIAGNOSIS — E860 Dehydration: Secondary | ICD-10-CM | POA: Diagnosis present

## 2015-12-18 DIAGNOSIS — I10 Essential (primary) hypertension: Secondary | ICD-10-CM | POA: Diagnosis present

## 2015-12-18 DIAGNOSIS — K219 Gastro-esophageal reflux disease without esophagitis: Secondary | ICD-10-CM | POA: Diagnosis present

## 2015-12-18 DIAGNOSIS — R079 Chest pain, unspecified: Secondary | ICD-10-CM | POA: Diagnosis not present

## 2015-12-18 DIAGNOSIS — I2602 Saddle embolus of pulmonary artery with acute cor pulmonale: Secondary | ICD-10-CM

## 2015-12-18 DIAGNOSIS — Z91030 Bee allergy status: Secondary | ICD-10-CM | POA: Diagnosis not present

## 2015-12-18 DIAGNOSIS — R0600 Dyspnea, unspecified: Secondary | ICD-10-CM | POA: Diagnosis present

## 2015-12-18 DIAGNOSIS — E7800 Pure hypercholesterolemia, unspecified: Secondary | ICD-10-CM | POA: Diagnosis present

## 2015-12-18 DIAGNOSIS — R0602 Shortness of breath: Secondary | ICD-10-CM | POA: Diagnosis present

## 2015-12-18 DIAGNOSIS — Z880 Allergy status to penicillin: Secondary | ICD-10-CM | POA: Diagnosis not present

## 2015-12-18 LAB — BASIC METABOLIC PANEL
ANION GAP: 7 (ref 5–15)
BUN: 21 mg/dL — ABNORMAL HIGH (ref 6–20)
CALCIUM: 8.5 mg/dL — AB (ref 8.9–10.3)
CO2: 19 mmol/L — AB (ref 22–32)
Chloride: 108 mmol/L (ref 101–111)
Creatinine, Ser: 1.42 mg/dL — ABNORMAL HIGH (ref 0.61–1.24)
GFR, EST AFRICAN AMERICAN: 53 mL/min — AB (ref >60)
GFR, EST NON AFRICAN AMERICAN: 46 mL/min — AB (ref >60)
Glucose, Bld: 134 mg/dL — ABNORMAL HIGH (ref 65–99)
POTASSIUM: 4.2 mmol/L (ref 3.5–5.1)
Sodium: 134 mmol/L — ABNORMAL LOW (ref 135–145)

## 2015-12-18 LAB — CBC
HEMATOCRIT: 40.2 % (ref 39.0–52.0)
HEMOGLOBIN: 13.7 g/dL (ref 13.0–17.0)
MCH: 30.2 pg (ref 26.0–34.0)
MCHC: 34.1 g/dL (ref 30.0–36.0)
MCV: 88.5 fL (ref 78.0–100.0)
Platelets: 152 10*3/uL (ref 150–400)
RBC: 4.54 MIL/uL (ref 4.22–5.81)
RDW: 12.9 % (ref 11.5–15.5)
WBC: 11.4 10*3/uL — ABNORMAL HIGH (ref 4.0–10.5)

## 2015-12-18 LAB — HEPARIN LEVEL (UNFRACTIONATED)
HEPARIN UNFRACTIONATED: 0.71 [IU]/mL — AB (ref 0.30–0.70)
HEPARIN UNFRACTIONATED: 0.51 [IU]/mL (ref 0.30–0.70)

## 2015-12-18 LAB — TROPONIN I: TROPONIN I: 0.31 ng/mL — AB (ref <0.031)

## 2015-12-18 MED ORDER — FENTANYL CITRATE (PF) 100 MCG/2ML IJ SOLN: 12.5000 ug | INTRAMUSCULAR | Status: DC | PRN

## 2015-12-18 MED ORDER — ALUM & MAG HYDROXIDE-SIMETH 200-200-20 MG/5ML PO SUSP
30.0000 mL | ORAL | Status: DC | PRN
  Administered 2015-12-18: 30 mL via ORAL
  Filled 2015-12-18: qty 30

## 2015-12-18 NOTE — Progress Notes (Signed)
[facility] for heparin Indication: pulmonary embolus  Allergies  Allergen Reactions  . Bee Venom   . Penicillins Rash    Patient Measurements: Height: 5\' 7"  (170.2 cm) Weight: 182 lb 5.1 oz (82.7 kg) IBW/kg (Calculated) : 66.1 Heparin Dosing weight: 80   Vital Signs: Temp: 98.7 F (37.1 C) (12/21 0000) Temp Source: Oral (12/21 0000) BP: 172/98 mmHg (12/21 0000) Pulse Rate: 115 (12/21 0000)  Labs:  Recent Labs  12/17/15 0956 12/17/15 1337 12/17/15 1934 12/17/15 2323  HGB 14.2  --   --   --   HCT 41.9  --   --   --   PLT 144*  --   --   --   HEPARINUNFRC  --   --   --  1.02*  CREATININE 1.40*  --   --   --   TROPONINI 0.05* 0.29* 0.39*  --     Estimated Creatinine Clearance: 44.7 mL/min (by C-G formula based on Cr of 1.4).   Medical History: Past Medical History  Diagnosis Date  . High cholesterol   . Reflux     Medications:  See medication history  Assessment: 80 yo man with LOC earlier today on heparin for PE. Initial heparin level is above goal. Goal of Therapy:  Heparin level 0.3-0.7 units/ml Monitor platelets by anticoagulation protocol: Yes   Plan:  Decrease heparin drip to 1100 units/hr Check heparin level ~6-8 hours Monitor for bleeding complications  Thanks for allowing pharmacy to be a part of this patient's care.  Excell Seltzer, PharmD Clinical Pharmacist 12/18/2015,12:47 AM

## 2015-12-18 NOTE — Progress Notes (Signed)
[place] [facility] for heparin Indication: pulmonary embolus  Allergies  Allergen Reactions  . Bee Venom   . Penicillins Rash   Patient Measurements: Height: 5\' 7"  (170.2 cm) Weight: 181 lb 14.1 oz (82.5 kg) IBW/kg (Calculated) : 66.1 Heparin Dosing weight: 80   Vital Signs: Temp: 98 F (36.7 C) (12/21 0803) Temp Source: Axillary (12/21 0803) BP: 142/89 mmHg (12/21 0800) Pulse Rate: 106 (12/21 0800)  Labs:  Recent Labs  12/17/15 0956 12/17/15 1337 12/17/15 1934 12/17/15 2323 12/18/15 0205 12/18/15 0755  HGB 14.2  --   --   --  13.7  --   HCT 41.9  --   --   --  40.2  --   PLT 144*  --   --   --  152  --   HEPARINUNFRC  --   --   --  1.02*  --  0.71*  CREATININE 1.40*  --   --   --  1.42*  --   TROPONINI 0.05* 0.29* 0.39*  --  0.31*  --    Estimated Creatinine Clearance: 44.1 mL/min (by C-G formula based on Cr of 1.42).  Medical History: Past Medical History  Diagnosis Date  . High cholesterol   . Reflux    Medications:  See medication history  Assessment: 80 yo man on heparin for PE. Initial heparin level is above goal but has now trended down to upper limit of target range although slightly above target.    Goal of Therapy:  Heparin level 0.3-0.7 units/ml Monitor platelets by anticoagulation protocol: Yes   Plan:  Decrease heparin drip to 1050 units/hr Check heparin level ~6-8 hours and daily CBC daily while on Heparin Monitor for bleeding complications  Thanks for allowing pharmacy to be a part of this patient's care.  Hart Robinsons, PharmD Clinical Pharmacist 12/18/2015,9:42 AM

## 2015-12-18 NOTE — Progress Notes (Signed)
Patient with documented PE by CT angiogram and true syncopal episode accompanying the initial event 2-D echo reveals some RV strain currently on IV heparin per pharmacy protocol not started on any oral H as per advice of a link physician to keep interventional options open. We'll obtain venous Doppler of both lower extremities today A she was hemodynamically stable at present Jacob Bauer [id number] DOB: [birth year] DOA: 12/17/2015 PCP: Lanette Hampshire, MD             Physical Exam: Blood pressure 141/94, pulse 104, temperature 98 F (36.7 C), temperature source Axillary, resp. rate 19, height 5\' 7"  (1.702 m), weight 181 lb 14.1 oz (82.5 kg), SpO2 93 %. lungs clear to A&P no rales wheeze rhonchi heart regular rhythm no S3-S4 no heaves thrills rubs abdomen soft nontender bowel sounds normoactive no evidence of DVT no palpable cord or Homans sign negative bilaterally   Investigations:  No results found for this or any previous visit (from the past 240 hour(s)).   Basic Metabolic Panel:  Recent Labs  12/17/15 0956 12/18/15 0205  NA 138 134*  K 3.9 4.2  CL 105 108  CO2 24 19*  GLUCOSE 171* 134*  BUN 19 21*  CREATININE 1.40* 1.42*  CALCIUM 9.1 8.5*   Liver Function Tests:  Recent Labs  12/17/15 0956  AST 21  ALT 19  ALKPHOS 66  BILITOT 0.6  PROT 7.2  ALBUMIN 3.3*     CBC:  Recent Labs  12/17/15 0956 12/18/15 0205  WBC 11.6* 11.4*  NEUTROABS 8.7*  --   HGB 14.2 13.7  HCT 41.9 40.2  MCV 90.3 88.5  PLT 144* 152    Ct Head Wo Contrast  12/17/2015  CLINICAL DATA:  Syncope this morning with a blow to the forehead. Initial encounter. EXAM: CT HEAD WITHOUT CONTRAST TECHNIQUE: Contiguous axial images were obtained from the base of the skull through the vertex without intravenous contrast. COMPARISON:  None. FINDINGS: The brain is atrophic with extensive chronic microvascular ischemic change. No evidence of acute intracranial abnormality including hemorrhage,  infarct, mass lesion, mass effect, midline shift or abnormal extra-axial fluid collection is seen. There is no hydrocephalus or pneumocephalus. The calvarium is intact. Imaged paranasal sinuses and mastoid air cells are clear. IMPRESSION: No acute abnormality. Electronically Signed   By: Inge Rise M.D.   On: 12/17/2015 11:18   Ct Angio Chest Pe W/cm &/or Wo Cm  12/17/2015  CLINICAL DATA:  Syncopal episode today with chest pain and shortness of breath EXAM: CT ANGIOGRAPHY CHEST WITH CONTRAST TECHNIQUE: Multidetector CT imaging of the chest was performed using the standard protocol during bolus administration of intravenous contrast. Multiplanar CT image reconstructions and MIPs were obtained to evaluate the vascular anatomy. CONTRAST:  143mL OMNIPAQUE IOHEXOL 350 MG/ML SOLN COMPARISON:  None. FINDINGS: Lungs are well aerated bilaterally. No focal confluent infiltrate is seen. Small left-sided pleural effusion is noted. The thoracic inlet is within normal limits. The thoracic aorta and its branches show mild atherosclerotic calcification without aneurysmal dilatation or dissection. The pulmonary artery is well visualized and demonstrates significant bilateral pulmonary emboli as well as a central saddle embolus. The RV LV ratio measures 1.1 indicating a degree of right heart strain. No hilar or mediastinal adenopathy is seen. The visualized upper abdomen is within normal limits. No acute bony abnormality is noted. Review of the MIP images confirms the above findings. IMPRESSION: Positive for acute PE with CT evidence of right heart strain (RV/LV Ratio = 1.1) consistent  with at least submassive (intermediate risk) PE. The presence of right heart strain has been associated with an increased risk of morbidity and mortality. Please activate Code PE by paging [phone]. Small left pleural effusion Critical Value/emergent results were called by telephone at the time of interpretation on 12/17/2015 at 8:04 pm  to Dr. Darrick Meigs, who verbally acknowledged these results. Electronically Signed   By: Inez Catalina M.D.   On: 12/17/2015 20:12   Dg Chest Port 1 View  12/17/2015  CLINICAL DATA:  Left chest pain EXAM: PORTABLE CHEST 1 VIEW COMPARISON:  05/05/2005 FINDINGS: Heart size is upper normal.  Negative for heart failure. Lungs are clear without infiltrate or effusion. Negative for mass lesion. No interval change. IMPRESSION: No active disease. Electronically Signed   By: Franchot Gallo M.D.   On: 12/17/2015 10:09      Medications:   Impression: Pulmonary embolism  Active Problems:   Chest pain   Syncope and collapse   HLD (hyperlipidemia)   GERD (gastroesophageal reflux disease)   Syncope     Plan: Continue full anticoagulation as per pharmacy protocol with heparin obtain venous Doppler of both lower extremities will transition to oral agent and the timing of this at the advice of cardiology E linc recommended IV heparin to maintain possible intervention if clinically indicated   Consultants: Cardiology and e links    Procedures   Antibiotics:                   Code Status: Full  Family Communication:  Spoke with wife and son  Disposition Plan see plan above  Time spent: 30 minutes     [name]   12/18/2015, 11:23 AM

## 2015-12-18 NOTE — Progress Notes (Signed)
[facility] for heparin Indication: pulmonary embolus  Allergies  Allergen Reactions  . Bee Venom   . Penicillins Rash   Patient Measurements: Height: 5\' 7"  (170.2 cm) Weight: 181 lb 14.1 oz (82.5 kg) IBW/kg (Calculated) : 66.1 Heparin Dosing weight: 80   Vital Signs: Temp: 98.3 F (36.8 C) (12/21 1304) Temp Source: Oral (12/21 1304) BP: 151/90 mmHg (12/21 1300) Pulse Rate: 105 (12/21 1300)  Labs:  Recent Labs  12/17/15 0956 12/17/15 1337 12/17/15 1934 12/17/15 2323 12/18/15 0205 12/18/15 0755 12/18/15 1425  HGB 14.2  --   --   --  13.7  --   --   HCT 41.9  --   --   --  40.2  --   --   PLT 144*  --   --   --  152  --   --   HEPARINUNFRC  --   --   --  1.02*  --  0.71* 0.51  CREATININE 1.40*  --   --   --  1.42*  --   --   TROPONINI 0.05* 0.29* 0.39*  --  0.31*  --   --    Estimated Creatinine Clearance: 44.1 mL/min (by C-G formula based on Cr of 1.42).  Medical History: Past Medical History  Diagnosis Date  . High cholesterol   . Reflux    Medications:  See medication history  Assessment: 80 yo man on heparin for PE. Initial heparin level was above goal initially but has now trended down to target range x 2 consecutive checks.      Goal of Therapy:  Heparin level 0.3-0.7 units/ml Monitor platelets by anticoagulation protocol: Yes   Plan:  Continue heparin drip at 1050 units/hr Check heparin level daily CBC daily while on Heparin Monitor for bleeding complications  Thanks for allowing pharmacy to be a part of this patient's care.  Hart Robinsons, PharmD Clinical Pharmacist 12/18/2015,2:55 PM

## 2015-12-18 NOTE — Consult Note (Signed)
Reason for Consult:   Pulmonary embolism  Requesting Physician Dr Cindie Laroche  Primary Cardiologist New  HPI:   80 y/o male with no prior history of CAD, syncope, or blood clots, presented 12/17/15 after syncope and collapse at home. The pt related a history of a "viral" illness 2 weeks prior. After this he had exertional dyspnea and chest "tightness". After admission his Troponin was positive and echo suggested possible PE with mild RVD. EKG shows S in 1 and TWI in 3. E-link was notified (Dr Lynford Citizen) and anticoagulation started- see his recommendations.             The pt denies any history of recent injury, long car or plane trips, or immobilization. He has no history of cancer. He is currently not SOB sitting up in bed.   PMHx:  Past Medical History  Diagnosis Date  . High cholesterol   . Reflux     History reviewed. No pertinent past surgical history.  SOCHx:  reports that he has quit smoking. His smoking use included Cigarettes. He quit after 10 years of use. He does not have any smokeless tobacco history on file. He reports that he does not drink alcohol or use illicit drugs.  FAMHx: History reviewed. No pertinent family history.  ALLERGIES: Allergies  Allergen Reactions  . Bee Venom   . Penicillins Rash    ROS: Review of Systems: General: negative for chills, fever, night sweats or weight changes.  Cardiovascular: no edema, orthopnea, palpitations, paroxysmal nocturnal dyspnea or shortness of breath HEENT: negative for any visual disturbances, blindness, glaucoma Dermatological: negative for rash Respiratory: negative for cough, hemoptysis, or wheezing Urologic: negative for hematuria or dysuria Abdominal: negative for nausea, vomiting, diarrhea, bright red blood per rectum, melena, or hematemesis Neurologic: negative for visual changes, syncope, or dizziness Musculoskeletal: negative for back pain, joint pain, or swelling Psych: cooperative and  appropriate All other systems reviewed and are otherwise negative except as noted above.   HOME MEDICATIONS: Prior to Admission medications   Medication Sig Start Date End Date Taking? Authorizing Provider  acetaminophen (TYLENOL) 325 MG tablet Take 650 mg by mouth every 6 (six) hours as needed.   Yes Historical Provider, MD  cetirizine (ZYRTEC ALLERGY) 10 MG tablet Take 10 mg by mouth daily.   Yes Historical Provider, MD  Coenzyme Q10 (CO Q-10) 100 MG CAPS Take 1 tablet by mouth daily.   Yes Historical Provider, MD  guaiFENesin-dextromethorphan (ROBITUSSIN DM) 100-10 MG/5ML syrup Take 15 mLs by mouth every 4 (four) hours as needed for cough.   Yes Historical Provider, MD  Multiple Vitamins-Minerals (CENTRUM ADULTS) TABS Take 1 tablet by mouth daily.   Yes Historical Provider, MD  Multiple Vitamins-Minerals (MULTIVITAMIN WITH IRON-MINERALS) liquid Take by mouth daily.   Yes Historical Provider, MD  ranitidine (ZANTAC) 150 MG tablet Take 150 mg by mouth 2 (two) times daily.   Yes Historical Provider, MD  simvastatin (ZOCOR) 40 MG tablet Take 40 mg by mouth daily.   Yes Historical Provider, MD    HOSPITAL MEDICATIONS: I have reviewed the patient's current medications.  VITALS: Blood pressure 141/94, pulse 104, temperature 98 F (36.7 C), temperature source Axillary, resp. rate 19, height 5\' 7"  (1.702 m), weight 181 lb 14.1 oz (82.5 kg), SpO2 93 %.  PHYSICAL EXAM: General appearance: alert, cooperative and no distress-abrasion on forhead Neck: no carotid bruit and no JVD Lungs: clear to auscultation bilaterally Heart: regular rate and rhythm, S1, S2  normal, no murmur, click, rub or gallop Abdomen: soft, non-tender; bowel sounds normal; no masses,  no organomegaly Extremities: extremities normal, atraumatic, no cyanosis or edema, negative Homans' sign Pulses: 2+ and symmetric Skin: Skin color, texture, turgor normal. No rashes or lesions Neurologic: Grossly normal  LABS: Results for  orders placed or performed during the hospital encounter of 12/17/15 (from the past 24 hour(s))  Urinalysis, Routine w reflex microscopic (not at [facility])     Status: Abnormal   Collection Time: 12/17/15 12:35 PM  Result Value Ref Range   Color, Urine YELLOW YELLOW   APPearance CLEAR CLEAR   Specific Gravity, Urine 1.020 1.005 - 1.030   pH 5.5 5.0 - 8.0   Glucose, UA NEGATIVE NEGATIVE mg/dL   Hgb urine dipstick TRACE (A) NEGATIVE   Bilirubin Urine NEGATIVE NEGATIVE   Ketones, ur NEGATIVE NEGATIVE mg/dL   Protein, ur NEGATIVE NEGATIVE mg/dL   Nitrite NEGATIVE NEGATIVE   Leukocytes, UA NEGATIVE NEGATIVE  Urine microscopic-add on     Status: Abnormal   Collection Time: 12/17/15 12:35 PM  Result Value Ref Range   Squamous Epithelial / LPF 0-5 (A) NONE SEEN   WBC, UA 0-5 0 - 5 WBC/hpf   RBC / HPF 0-5 0 - 5 RBC/hpf   Bacteria, UA RARE (A) NONE SEEN   Casts HYALINE CASTS (A) NEGATIVE  Troponin I     Status: Abnormal   Collection Time: 12/17/15  1:37 PM  Result Value Ref Range   Troponin I 0.29 (H) <0.031 ng/mL  TSH     Status: None   Collection Time: 12/17/15  1:37 PM  Result Value Ref Range   TSH 2.442 0.350 - 4.500 uIU/mL  D-dimer, quantitative (not at [facility])     Status: Abnormal   Collection Time: 12/17/15  1:37 PM  Result Value Ref Range   D-Dimer, Quant 16.00 (H) 0.00 - 0.50 ug/mL-FEU  Troponin I     Status: Abnormal   Collection Time: 12/17/15  7:34 PM  Result Value Ref Range   Troponin I 0.39 (H) <0.031 ng/mL  Heparin level (unfractionated)     Status: Abnormal   Collection Time: 12/17/15 11:23 PM  Result Value Ref Range   Heparin Unfractionated 1.02 (H) 0.30 - 0.70 IU/mL  Basic metabolic panel     Status: Abnormal   Collection Time: 12/18/15  2:05 AM  Result Value Ref Range   Sodium 134 (L) 135 - 145 mmol/L   Potassium 4.2 3.5 - 5.1 mmol/L   Chloride 108 101 - 111 mmol/L   CO2 19 (L) 22 - 32 mmol/L   Glucose, Bld 134 (H) 65 - 99 mg/dL   BUN 21 (H) 6 - 20 mg/dL    Creatinine, Ser 1.42 (H) 0.61 - 1.24 mg/dL   Calcium 8.5 (L) 8.9 - 10.3 mg/dL   GFR calc non Af Amer 46 (L) >60 mL/min   GFR calc Af Amer 53 (L) >60 mL/min   Anion gap 7 5 - 15  CBC     Status: Abnormal   Collection Time: 12/18/15  2:05 AM  Result Value Ref Range   WBC 11.4 (H) 4.0 - 10.5 K/uL   RBC 4.54 4.22 - 5.81 MIL/uL   Hemoglobin 13.7 13.0 - 17.0 g/dL   HCT 40.2 39.0 - 52.0 %   MCV 88.5 78.0 - 100.0 fL   MCH 30.2 26.0 - 34.0 pg   MCHC 34.1 30.0 - 36.0 g/dL   RDW 12.9 11.5 - 15.5 %  Platelets 152 150 - 400 K/uL  Troponin I     Status: Abnormal   Collection Time: 12/18/15  2:05 AM  Result Value Ref Range   Troponin I 0.31 (H) <0.031 ng/mL  Heparin level (unfractionated)     Status: Abnormal   Collection Time: 12/18/15  7:55 AM  Result Value Ref Range   Heparin Unfractionated 0.71 (H) 0.30 - 0.70 IU/mL    EKG: NSR, NSST changes- S1, TWI 3  IMAGING: Ct Head Wo Contrast  12/17/2015  CLINICAL DATA:  Syncope this morning with a blow to the forehead. Initial encounter. EXAM: CT HEAD WITHOUT CONTRAST TECHNIQUE: Contiguous axial images were obtained from the base of the skull through the vertex without intravenous contrast. COMPARISON:  None. FINDINGS: The brain is atrophic with extensive chronic microvascular ischemic change. No evidence of acute intracranial abnormality including hemorrhage, infarct, mass lesion, mass effect, midline shift or abnormal extra-axial fluid collection is seen. There is no hydrocephalus or pneumocephalus. The calvarium is intact. Imaged paranasal sinuses and mastoid air cells are clear. IMPRESSION: No acute abnormality. Electronically Signed   By: Inge Rise M.D.   On: 12/17/2015 11:18   Ct Angio Chest Pe W/cm &/or Wo Cm  12/17/2015  CLINICAL DATA:  Syncopal episode today with chest pain and shortness of breath EXAM: CT ANGIOGRAPHY CHEST WITH CONTRAST TECHNIQUE: Multidetector CT imaging of the chest was performed using the standard protocol during  bolus administration of intravenous contrast. Multiplanar CT image reconstructions and MIPs were obtained to evaluate the vascular anatomy. CONTRAST:  120mL OMNIPAQUE IOHEXOL 350 MG/ML SOLN COMPARISON:  None. FINDINGS: Lungs are well aerated bilaterally. No focal confluent infiltrate is seen. Small left-sided pleural effusion is noted. The thoracic inlet is within normal limits. The thoracic aorta and its branches show mild atherosclerotic calcification without aneurysmal dilatation or dissection. The pulmonary artery is well visualized and demonstrates significant bilateral pulmonary emboli as well as a central saddle embolus. The RV LV ratio measures 1.1 indicating a degree of right heart strain. No hilar or mediastinal adenopathy is seen. The visualized upper abdomen is within normal limits. No acute bony abnormality is noted. Review of the MIP images confirms the above findings. IMPRESSION: Positive for acute PE with CT evidence of right heart strain (RV/LV Ratio = 1.1) consistent with at least submassive (intermediate risk) PE. The presence of right heart strain has been associated with an increased risk of morbidity and mortality. Please activate Code PE by paging [phone]. Small left pleural effusion Critical Value/emergent results were called by telephone at the time of interpretation on 12/17/2015 at 8:04 pm to Dr. Darrick Meigs, who verbally acknowledged these results. Electronically Signed   By: Inez Catalina M.D.   On: 12/17/2015 20:12   Dg Chest Port 1 View  12/17/2015  CLINICAL DATA:  Left chest pain EXAM: PORTABLE CHEST 1 VIEW COMPARISON:  05/05/2005 FINDINGS: Heart size is upper normal.  Negative for heart failure. Lungs are clear without infiltrate or effusion. Negative for mass lesion. No interval change. IMPRESSION: No active disease. Electronically Signed   By: Franchot Gallo M.D.   On: 12/17/2015 10:09    IMPRESSION: Principal Problem:   Syncope and collapse Active Problems:   Acute  pulmonary embolism (HCC)   Chest pain   Dyspnea   Renal insufficiency 3- unclear duration   HLD (hyperlipidemia)   GERD (gastroesophageal reflux disease)   RECOMMENDATION: Will review recommendations for anticoagulation with Dr Harrington Challenger.  Time Spent Directly with Patient: 35 minutes  Kerin Ransom,  PA  [phone] beeper 12/18/2015, 11:40 AM    Pt seen and examined  Agree with findings of L Kil[place] above  Pt presents with syncope  Found to have submassive PE (saddle embolus) Evid of signif RV strain . Now on heparin Since yesterday heart rates have improved.  Now in 90s most of time  BP is OK  Sats OK at rest.  Etiol for DVT/PE  No recent car ride.  Sleeps in recliner, sometimes in sitting postion, legs dangling. No recent surgery   ? Hypercoagulable.  Can check hypercoagulable serologies  At some point consider scan these are negative    Will need lifelong anticoagulation with above For now IV heparin.  I agree with assessment by Ann Lions --!V heparin for 5 days then initiate oral anticoagulaion  Follow for any signs of decompenstion.   At which point thrombolysis would be considered  Dorris Carnes

## 2015-12-18 NOTE — Care Management Note (Signed)
Case Management Note  Patient Details  Name: Jacob Bauer MRN: [id number] Date of Birth: 12-14-1935  Subjective/Objective:                  Pt is from home, lives with his wife and is ind with ADLs. Pt has no HH services or DME's prior to admission. Pt dx with PE.   Action/Plan: Pt plans to return home with self care. Pt may need benefits check once anticoagulant is decided on. Will cont to follow for DC planning.   Expected Discharge Date:  12/20/15               Expected Discharge Plan:  Home/Self Care  In-House Referral:  NA  Discharge planning Services  CM Consult  Post Acute Care Choice:  NA Choice offered to:  NA  DME Arranged:    DME Agency:     HH Arranged:    HH Agency:     Status of Service:  In process, will continue to follow  Medicare Important Message Given:    Date Medicare IM Given:    Medicare IM give by:    Date Additional Medicare IM Given:    Additional Medicare Important Message give by:     If discussed at [place] of Stay Meetings, dates discussed:    Additional Comments:  Sherald Barge, RN 12/18/2015, 2:09 PM

## 2015-12-18 NOTE — Consult Note (Signed)
Full note dictated. He had syncope and found to have pulmonary emboli. He is on appropriate treatment but needs close monitoring due to right heart strain.

## 2015-12-18 NOTE — Progress Notes (Signed)
[facility] Progress Note Patient Name: OSMIN AKRIDGE DOB: [birth year] MRN: [id number]   Date of Service  12/18/2015  HPI/Events of Note  Patient c/o heartburn/indigestion in setting of PE.  eICU Interventions  Will order: 1. Mylanta 30 mL PO Q 4 hours PRN. 2. If Mylanta not effective, Fentanyl 12.5-25 mcg IV Q 2 hours PRN.     Intervention Category Intermediate Interventions: Pain - evaluation and management  Meryn Sarracino Eugene 12/18/2015, 2:08 AM

## 2015-12-18 NOTE — Clinical Documentation Improvement (Signed)
Cardiology Internal Medicine  "Renal insufficiency - 3" documented in current chart.  Please document in your future progress note and discharge summary if the "renal insufficiency - 3" can be further specified / clarified.     Chronic kidney disease, stage 3  Other  Clinically Undetermined  Supporting Information: Current Hospital Renal Labs: Component      BUN Creatinine  Latest Ref Rng      6 - 20 mg/dL 0.61 - 1.24 mg/dL  12/17/2015     9:56 AM 19 1.40 (H)  12/18/2015     2:05 AM 21 (H) 1.42 (H)     Please exercise your independent, professional judgment when responding. A specific answer is not anticipated or expected.Please update your documentation within the medical record to reflect your response to this query.    Thank you, Mateo Flow, RN [phone] Clinical Documentation Specialist

## 2015-12-19 LAB — CBC
HEMATOCRIT: 36.2 % — AB (ref 39.0–52.0)
Hemoglobin: 12.4 g/dL — ABNORMAL LOW (ref 13.0–17.0)
MCH: 30.5 pg (ref 26.0–34.0)
MCHC: 34.3 g/dL (ref 30.0–36.0)
MCV: 88.9 fL (ref 78.0–100.0)
Platelets: 136 10*3/uL — ABNORMAL LOW (ref 150–400)
RBC: 4.07 MIL/uL — AB (ref 4.22–5.81)
RDW: 13 % (ref 11.5–15.5)
WBC: 8.5 10*3/uL (ref 4.0–10.5)

## 2015-12-19 LAB — ANTITHROMBIN III: ANTITHROMB III FUNC: 66 % — AB (ref 75–120)

## 2015-12-19 LAB — MRSA PCR SCREENING: MRSA BY PCR: NEGATIVE

## 2015-12-19 LAB — HEPARIN LEVEL (UNFRACTIONATED): Heparin Unfractionated: 0.33 IU/mL (ref 0.30–0.70)

## 2015-12-19 MED ORDER — LISINOPRIL 5 MG PO TABS
2.5000 mg | ORAL_TABLET | Freq: Every day | ORAL | Status: DC
  Administered 2015-12-19 – 2015-12-23 (×5): 2.5 mg via ORAL
  Filled 2015-12-19 (×5): qty 1

## 2015-12-19 NOTE — Progress Notes (Signed)
Subjective: Feels a little better  No CP  Breathing is a little beter   Objective: Filed Vitals:   12/19/15 0800 12/19/15 0812 12/19/15 0900 12/19/15 1000  BP: 159/86  156/87 143/96  Pulse: 97  99 92  Temp:  97.2 F (36.2 C)    TempSrc:  Axillary    Resp: 17  20 18   Height:      Weight:      SpO2: 95%  94% 95%   Weight change: -1.015 kg (-2 lb 3.8 oz)  Intake/Output Summary (Last 24 hours) at 12/19/15 1107 Last data filed at 12/19/15 1000  Gross per 24 hour  Intake 3898.92 ml  Output   2951 ml  Net 947.92 ml    General: Alert, awake, oriented x3, in no acute distress Neck:  JVP is normal Heart: Regular rate and rhythm, without murmurs, rubs, gallops.  Lungs: Clear to auscultation.  No rales or wheezes. Exemities:  No edema.   Neuro: Grossly intact, nonfocal.  Tele:  SR    Lab Results: Results for orders placed or performed during the hospital encounter of 12/17/15 (from the past 24 hour(s))  Heparin level (unfractionated)     Status: None   Collection Time: 12/18/15  2:25 PM  Result Value Ref Range   Heparin Unfractionated 0.51 0.30 - 0.70 IU/mL  CBC     Status: Abnormal   Collection Time: 12/19/15  4:49 AM  Result Value Ref Range   WBC 8.5 4.0 - 10.5 K/uL   RBC 4.07 (L) 4.22 - 5.81 MIL/uL   Hemoglobin 12.4 (L) 13.0 - 17.0 g/dL   HCT 36.2 (L) 39.0 - 52.0 %   MCV 88.9 78.0 - 100.0 fL   MCH 30.5 26.0 - 34.0 pg   MCHC 34.3 30.0 - 36.0 g/dL   RDW 13.0 11.5 - 15.5 %   Platelets 136 (L) 150 - 400 K/uL  Heparin level (unfractionated)     Status: None   Collection Time: 12/19/15  4:49 AM  Result Value Ref Range   Heparin Unfractionated 0.33 0.30 - 0.70 IU/mL    Studies/Results: US Venous Img Lower Bilateral  12/18/2015  CLINICAL DATA:  Significant PE with right heart strain EXAM: BILATERAL LOWER EXTREMITY VENOUS DOPPLER ULTRASOUND TECHNIQUE: Gray-scale sonography with graded compression, as well as color Doppler and duplex ultrasound were performed to evaluate  the lower extremity deep venous systems from the level of the common femoral vein and including the common femoral, femoral, profunda femoral, popliteal and calf veins including the posterior tibial, peroneal and gastrocnemius veins when visible. The superficial great saphenous vein was also interrogated. Spectral Doppler was utilized to evaluate flow at rest and with distal augmentation maneuvers in the common femoral, femoral and popliteal veins. COMPARISON:  None. FINDINGS: RIGHT LOWER EXTREMITY Common Femoral Vein: No evidence of thrombus. Normal compressibility, respiratory phasicity and response to augmentation. Saphenofemoral Junction: No evidence of thrombus. Normal compressibility and flow on color Doppler imaging. Profunda Femoral Vein: No evidence of thrombus. Normal compressibility and flow on color Doppler imaging. Femoral Vein: No evidence of thrombus. Normal compressibility, respiratory phasicity and response to augmentation. Popliteal Vein: No evidence of thrombus. Normal compressibility, respiratory phasicity and response to augmentation. Calf Veins: No evidence of thrombus. Normal compressibility and flow on color Doppler imaging. Superficial Great Saphenous Vein: No evidence of thrombus. Normal compressibility and flow on color Doppler imaging. Venous Reflux:  None. Other Findings:  None. LEFT LOWER EXTREMITY Common Femoral Vein: Nonocclusive thrombus is noted. Decreased compressibility  is seen. Saphenofemoral Junction: No evidence of thrombus. Normal compressibility and flow on color Doppler imaging. Profunda Femoral Vein: Nonocclusive thrombus is noted. Decreased compressibility is noted. Femoral Vein: No evidence of thrombus. Normal compressibility, respiratory phasicity and response to augmentation. Popliteal Vein: No evidence of thrombus. Normal compressibility, respiratory phasicity and response to augmentation. Calf Veins: Nonocclusive thrombus is noted in the posterior tibial and peroneal  veins. Decreased compressibility is noted. Superficial Great Saphenous Vein: No evidence of thrombus. Normal compressibility and flow on color Doppler imaging. Venous Reflux:  None. Other Findings:  None. IMPRESSION: No evidence of deep venous thrombosis on the right. Multifocal areas of deep venous thrombosis on the left. Electronically Signed   By: Inez Catalina M.D.   On: 12/18/2015 13:04    Medications:Reviewed  @PROBHOSP @  1  Pulmonary embolus  Pt in 3rd day of heparin HR has gone down some  BP is good, actually a little high   Reviewed note by Susann Givens.   Should complete 5 days the switch to oral anticoaguation.  Agree with lifelong Eval for hypercoagulable defer to primary team. WOuld get f/u echo in several months to reassess RVEF  2.  HTN  BP is high today  I would add low dose lisinopril to regimen  2.5 mg   Follow response   Dorris Carnes   LOS: 1 day   Dorris Carnes 12/19/2015, 11:07 AM

## 2015-12-19 NOTE — Progress Notes (Signed)
Patient appears hemodynamically stable a 2-1/2 PE no further chest pain or dyspnea currently on IV heparin will consider oral anticoagulation in several days.  Venous Doppler negative for DVT in both lower extremities Jacob Bauer [id number] DOB: November 19, 1935 DOA: 12/17/2015 PCP: Lanette Hampshire, MD             Physical Exam: Blood pressure 158/90, pulse 95, temperature 97.5 F (36.4 C), temperature source Oral, resp. rate 14, height 5\' 7"  (1.702 m), weight 181 lb 14.1 oz (82.5 kg), SpO2 93 %. lungs diminished breath sounds in bases no rales wheeze rhonchi appreciable heart regular rhythm no S3-S4 no heaves thrills rubs abdomen soft nontender bowel sounds normoactive. Extremities no palpable cord or Homans sign negative bilaterally   Investigations:  No results found for this or any previous visit (from the past 240 hour(s)).   Basic Metabolic Panel:  Recent Labs  12/17/15 0956 12/18/15 0205  NA 138 134*  K 3.9 4.2  CL 105 108  CO2 24 19*  GLUCOSE 171* 134*  BUN 19 21*  CREATININE 1.40* 1.42*  CALCIUM 9.1 8.5*   Liver Function Tests:  Recent Labs  12/17/15 0956  AST 21  ALT 19  ALKPHOS 66  BILITOT 0.6  PROT 7.2  ALBUMIN 3.3*     CBC:  Recent Labs  12/17/15 0956 12/18/15 0205 12/19/15 0449  WBC 11.6* 11.4* 8.5  NEUTROABS 8.7*  --   --   HGB 14.2 13.7 12.4*  HCT 41.9 40.2 36.2*  MCV 90.3 88.5 88.9  PLT 144* 152 136*    Ct Head Wo Contrast  12/17/2015  CLINICAL DATA:  Syncope this morning with a blow to the forehead. Initial encounter. EXAM: CT HEAD WITHOUT CONTRAST TECHNIQUE: Contiguous axial images were obtained from the base of the skull through the vertex without intravenous contrast. COMPARISON:  None. FINDINGS: The brain is atrophic with extensive chronic microvascular ischemic change. No evidence of acute intracranial abnormality including hemorrhage, infarct, mass lesion, mass effect, midline shift or abnormal extra-axial fluid collection  is seen. There is no hydrocephalus or pneumocephalus. The calvarium is intact. Imaged paranasal sinuses and mastoid air cells are clear. IMPRESSION: No acute abnormality. Electronically Signed   By: Inge Rise M.D.   On: 12/17/2015 11:18   Ct Angio Chest Pe W/cm &/or Wo Cm  12/17/2015  CLINICAL DATA:  Syncopal episode today with chest pain and shortness of breath EXAM: CT ANGIOGRAPHY CHEST WITH CONTRAST TECHNIQUE: Multidetector CT imaging of the chest was performed using the standard protocol during bolus administration of intravenous contrast. Multiplanar CT image reconstructions and MIPs were obtained to evaluate the vascular anatomy. CONTRAST:  117mL OMNIPAQUE IOHEXOL 350 MG/ML SOLN COMPARISON:  None. FINDINGS: Lungs are well aerated bilaterally. No focal confluent infiltrate is seen. Small left-sided pleural effusion is noted. The thoracic inlet is within normal limits. The thoracic aorta and its branches show mild atherosclerotic calcification without aneurysmal dilatation or dissection. The pulmonary artery is well visualized and demonstrates significant bilateral pulmonary emboli as well as a central saddle embolus. The RV LV ratio measures 1.1 indicating a degree of right heart strain. No hilar or mediastinal adenopathy is seen. The visualized upper abdomen is within normal limits. No acute bony abnormality is noted. Review of the MIP images confirms the above findings. IMPRESSION: Positive for acute PE with CT evidence of right heart strain (RV/LV Ratio = 1.1) consistent with at least submassive (intermediate risk) PE. The presence of right heart strain has been associated with an  increased risk of morbidity and mortality. Please activate Code PE by paging [phone]. Small left pleural effusion Critical Value/emergent results were called by telephone at the time of interpretation on 12/17/2015 at 8:04 pm to Dr. Darrick Meigs, who verbally acknowledged these results. Electronically Signed   By: Inez Catalina M.D.   On: 12/17/2015 20:12   US Venous Img Lower Bilateral  12/18/2015  CLINICAL DATA:  Significant PE with right heart strain EXAM: BILATERAL LOWER EXTREMITY VENOUS DOPPLER ULTRASOUND TECHNIQUE: Gray-scale sonography with graded compression, as well as color Doppler and duplex ultrasound were performed to evaluate the lower extremity deep venous systems from the level of the common femoral vein and including the common femoral, femoral, profunda femoral, popliteal and calf veins including the posterior tibial, peroneal and gastrocnemius veins when visible. The superficial great saphenous vein was also interrogated. Spectral Doppler was utilized to evaluate flow at rest and with distal augmentation maneuvers in the common femoral, femoral and popliteal veins. COMPARISON:  None. FINDINGS: RIGHT LOWER EXTREMITY Common Femoral Vein: No evidence of thrombus. Normal compressibility, respiratory phasicity and response to augmentation. Saphenofemoral Junction: No evidence of thrombus. Normal compressibility and flow on color Doppler imaging. Profunda Femoral Vein: No evidence of thrombus. Normal compressibility and flow on color Doppler imaging. Femoral Vein: No evidence of thrombus. Normal compressibility, respiratory phasicity and response to augmentation. Popliteal Vein: No evidence of thrombus. Normal compressibility, respiratory phasicity and response to augmentation. Calf Veins: No evidence of thrombus. Normal compressibility and flow on color Doppler imaging. Superficial Great Saphenous Vein: No evidence of thrombus. Normal compressibility and flow on color Doppler imaging. Venous Reflux:  None. Other Findings:  None. LEFT LOWER EXTREMITY Common Femoral Vein: Nonocclusive thrombus is noted. Decreased compressibility is seen. Saphenofemoral Junction: No evidence of thrombus. Normal compressibility and flow on color Doppler imaging. Profunda Femoral Vein: Nonocclusive thrombus is noted. Decreased  compressibility is noted. Femoral Vein: No evidence of thrombus. Normal compressibility, respiratory phasicity and response to augmentation. Popliteal Vein: No evidence of thrombus. Normal compressibility, respiratory phasicity and response to augmentation. Calf Veins: Nonocclusive thrombus is noted in the posterior tibial and peroneal veins. Decreased compressibility is noted. Superficial Great Saphenous Vein: No evidence of thrombus. Normal compressibility and flow on color Doppler imaging. Venous Reflux:  None. Other Findings:  None. IMPRESSION: No evidence of deep venous thrombosis on the right. Multifocal areas of deep venous thrombosis on the left. Electronically Signed   By: Inez Catalina M.D.   On: 12/18/2015 13:04   Dg Chest Port 1 View  12/17/2015  CLINICAL DATA:  Left chest pain EXAM: PORTABLE CHEST 1 VIEW COMPARISON:  05/05/2005 FINDINGS: Heart size is upper normal.  Negative for heart failure. Lungs are clear without infiltrate or effusion. Negative for mass lesion. No interval change. IMPRESSION: No active disease. Electronically Signed   By: Franchot Gallo M.D.   On: 12/17/2015 10:09      Medications:   Impression:  Principal Problem:   Syncope and collapse Active Problems:   Chest pain   HLD (hyperlipidemia)   GERD (gastroesophageal reflux disease)   Acute pulmonary embolism (HCC)   Dyspnea   Renal insufficiency 3- unclear duration   Shortness of breath     Plan: Continue IV heparin per protocol consider transition to oral agents in several days as per cardiology/pulmonology  Consultants: Cardiology and pulmonology   Procedures   Antibiotics:                   Code Status:  Family Communication:    Disposition Plan see plan above  Time spent: 30 minutes   LOS: 1 day   Zamiah Tollett M   12/19/2015, 6:54 AM

## 2015-12-19 NOTE — Progress Notes (Signed)
[facility] for heparin Indication: pulmonary embolus  Allergies  Allergen Reactions  . Bee Venom   . Penicillins Rash   Patient Measurements: Height: 5\' 7"  (170.2 cm) Weight: 182 lb 12.2 oz (82.9 kg) IBW/kg (Calculated) : 66.1 Heparin Dosing weight: 80   Vital Signs: Temp: 97.8 F (36.6 C) (12/22 0400) Temp Source: Oral (12/22 0400) BP: 156/88 mmHg (12/22 0700) Pulse Rate: 99 (12/22 0700)  Labs:  Recent Labs  12/17/15 0956 12/17/15 1337 12/17/15 1934  12/18/15 0205 12/18/15 0755 12/18/15 1425 12/19/15 0449  HGB 14.2  --   --   --  13.7  --   --  12.4*  HCT 41.9  --   --   --  40.2  --   --  36.2*  PLT 144*  --   --   --  152  --   --  136*  HEPARINUNFRC  --   --   --   < >  --  0.71* 0.51 0.33  CREATININE 1.40*  --   --   --  1.42*  --   --   --   TROPONINI 0.05* 0.29* 0.39*  --  0.31*  --   --   --   < > = values in this interval not displayed. Estimated Creatinine Clearance: 44.1 mL/min (by C-G formula based on Cr of 1.42).  Medical History: Past Medical History  Diagnosis Date  . High cholesterol   . Reflux    Medications:  See medication history  Assessment: 80 yo man on heparin for PE. Initial heparin level was above goal initially but has now trended down to target range and is therapeutic x 3 consecutive checks.   No bleeding reported, CBC OK.  Goal of Therapy:  Heparin level 0.3-0.7 units/ml Monitor platelets by anticoagulation protocol: Yes   Plan:  Continue heparin drip at 1050 units/hr Check heparin level daily CBC daily while on Heparin Monitor for bleeding complications  Thanks for allowing pharmacy to be a part of this patient's care.  Hart Robinsons, PharmD Clinical Pharmacist 12/19/2015,7:36 AM

## 2015-12-19 NOTE — Consult Note (Signed)
NAMELORAIN, WHORTON                ACCOUNT NO.:  [id number]  MEDICAL RECORD NO.:  [id number]  LOCATION:  IC04                          FACILITY:  [facility]  PHYSICIAN:  Jayshaun Phillips L. Luan Pulling, M.D.DATE OF BIRTH:  18-Oct-1935  DATE OF CONSULTATION:  12/18/2015 DATE OF DISCHARGE:                                CONSULTATION   Patient of Dr. Cindie Laroche.  REASON FOR CONSULTATION:  Pulmonary embolism.  HISTORY OF PRESENT ILLNESS:  This is a 80 year old, who had been in his usual state of fair health at home when he developed shortness of breath, chest pain, and developed loss of consciousness x2.  He said that it was on the left side of his chest.  He eventually underwent CT angiogram of the chest and was found to have pulmonary emboli.  His CT angio showed that he has pulmonary emboli with CT evidence of right heart strain with RV/LV ratio of 1:1 consistent with submassive pulmonary embolism.  He is currently on full intravenous anticoagulation, which is appropriate.  His chest pain has resolved.  He says he feels better.  He has DVT in the left leg, but none on the right on his venous Doppler.  There are multi-focal areas of DVT.  He did not have any precipitating factors for this.  No surgery, no prolonged bedrest, and he is not known to be hypercoagulable.  He has not had any problem with clotting in the past and does not have a family history of hypercoagulable state.  PAST MEDICAL HISTORY:  Positive for hypertension and GERD.  He has not had any recent surgery as mentioned.  He has remote smoking history, but stopped many years ago and only smoked for about 10 years.  He does not drink any alcohol.  He does not use any other drugs.  ALLERGIES:  He is allergic to BEE VENOM and PENICILLIN.  MEDICATIONS:  His medications at home were Tylenol, Zyrtec, Robitussin DM, multiple vitamins, Zantac, and Zocor.  Medications are reviewed here in the hospital.  FAMILY HISTORY:  Positive for heart  disease in his father, but otherwise, essentially negative for any sort of cardiac issues.  PHYSICAL EXAMINATION:  GENERAL:  Shows a well-developed, well-nourished male, who is in no acute distress.  HEENT:  His pupils are reactive. Nose and throat are clear.  Mucous membranes are moist. VITAL SIGNS:  His heart rate is around 100, but he is talking.  Blood pressure stable in the 120s. NECK:  Supple without masses. CHEST:  Shows bilateral rhonchi. HEART:  Regular without gallop. ABDOMEN:  Soft without masses. EXTREMITIES:  Show trace edema bilaterally.  He does not have any tenderness in his legs.  DIAGNOSTIC STUDIES:  His CT is as discussed above.  His venous Doppler is as discussed above.  His echocardiogram shows normal left ventricular function, but shows right ventricular systolic function mildly reduced with potential hypokinesis to akinesis of the apical free wall. Pulmonary artery systolic pressure was 38.  ASSESSMENT:  He has had submassive pulmonary emboli with evidence of right heart strain.  This is a special situation and is treated differently than other pulmonary emboli.  In that, practice is to treat for 5  days with intravenous anticoagulation.  Then, he could be transitioned to oral care, potentially one of the novel anticoagulants. He will need close followup to make sure that he does not develop overt right heart failure or hemodynamic instability.  If he does, more aggressive treatment would be needed.  Thanks for allowing me to see him with you.  Also, I think he will require lifelong anticoagulation after this.     Hero Kulish L. Luan Pulling, M.D.     ELH/MEDQ  D:  12/19/2015  T:  12/19/2015  Job:  [id number]

## 2015-12-19 NOTE — Progress Notes (Signed)
Subjective: He says he feels okay. No chest pain. He is not short of breath. No other new complaints.  Objective: Vital signs in last 24 hours: Temp:  [97.3 F (36.3 C)-98.3 F (36.8 C)] 97.5 F (36.4 C) (12/22 0000) Pulse Rate:  [95-106] 95 (12/22 0100) Resp:  [11-22] 14 (12/22 0100) BP: (137-159)/(76-97) 158/90 mmHg (12/22 0000) SpO2:  [93 %-96 %] 93 % (12/22 0100) Weight change:  Last BM Date: 12/17/15  Intake/Output from previous day: 12/21 0701 - 12/22 0700 In: 3908.9 [P.O.:1680; I.V.:2228.9] Out: 2476 [Urine:2475; Stool:1]  PHYSICAL EXAM General appearance: alert, cooperative and no distress Resp: clear to auscultation bilaterally Cardio: regular rate and rhythm, S1, S2 normal, no murmur, click, rub or gallop GI: soft, non-tender; bowel sounds normal; no masses,  no organomegaly Extremities: Minimal if any edema he is nontender in the legs  Lab Results:  Results for orders placed or performed during the hospital encounter of 12/17/15 (from the past 48 hour(s))  CBC with Differential/Platelet     Status: Abnormal   Collection Time: 12/17/15  9:56 AM  Result Value Ref Range   WBC 11.6 (H) 4.0 - 10.5 K/uL   RBC 4.64 4.22 - 5.81 MIL/uL   Hemoglobin 14.2 13.0 - 17.0 g/dL   HCT 41.9 39.0 - 52.0 %   MCV 90.3 78.0 - 100.0 fL   MCH 30.6 26.0 - 34.0 pg   MCHC 33.9 30.0 - 36.0 g/dL   RDW 12.9 11.5 - 15.5 %   Platelets 144 (L) 150 - 400 K/uL   Neutrophils Relative % 75 %   Neutro Abs 8.7 (H) 1.7 - 7.7 K/uL   Lymphocytes Relative 17 %   Lymphs Abs 2.0 0.7 - 4.0 K/uL   Monocytes Relative 5 %   Monocytes Absolute 0.6 0.1 - 1.0 K/uL   Eosinophils Relative 3 %   Eosinophils Absolute 0.3 0.0 - 0.7 K/uL   Basophils Relative 0 %   Basophils Absolute 0.0 0.0 - 0.1 K/uL  Comprehensive metabolic panel     Status: Abnormal   Collection Time: 12/17/15  9:56 AM  Result Value Ref Range   Sodium 138 135 - 145 mmol/L   Potassium 3.9 3.5 - 5.1 mmol/L   Chloride 105 101 - 111 mmol/L    CO2 24 22 - 32 mmol/L   Glucose, Bld 171 (H) 65 - 99 mg/dL   BUN 19 6 - 20 mg/dL   Creatinine, Ser 1.40 (H) 0.61 - 1.24 mg/dL   Calcium 9.1 8.9 - 10.3 mg/dL   Total Protein 7.2 6.5 - 8.1 g/dL   Albumin 3.3 (L) 3.5 - 5.0 g/dL   AST 21 15 - 41 U/L   ALT 19 17 - 63 U/L   Alkaline Phosphatase 66 38 - 126 U/L   Total Bilirubin 0.6 0.3 - 1.2 mg/dL   GFR calc non Af Amer 47 (L) >60 mL/min   GFR calc Af Amer 54 (L) >60 mL/min    Comment: (NOTE) The eGFR has been calculated using the CKD EPI equation. This calculation has not been validated in all clinical situations. eGFR's persistently <60 mL/min signify possible Chronic Kidney Disease.    Anion gap 9 5 - 15  Lipase, blood     Status: Abnormal   Collection Time: 12/17/15  9:56 AM  Result Value Ref Range   Lipase 58 (H) 11 - 51 U/L  Troponin I     Status: Abnormal   Collection Time: 12/17/15  9:56 AM  Result Value  Ref Range   Troponin I 0.05 (H) <0.031 ng/mL    Comment:        PERSISTENTLY INCREASED TROPONIN VALUES IN THE RANGE OF 0.04-0.49 ng/mL CAN BE SEEN IN:       -UNSTABLE ANGINA       -CONGESTIVE HEART FAILURE       -MYOCARDITIS       -CHEST TRAUMA       -ARRYHTHMIAS       -LATE PRESENTING MYOCARDIAL INFARCTION       -COPD   CLINICAL FOLLOW-UP RECOMMENDED.   Urinalysis, Routine w reflex microscopic (not at [facility])     Status: Abnormal   Collection Time: 12/17/15 12:35 PM  Result Value Ref Range   Color, Urine YELLOW YELLOW   APPearance CLEAR CLEAR   Specific Gravity, Urine 1.020 1.005 - 1.030   pH 5.5 5.0 - 8.0   Glucose, UA NEGATIVE NEGATIVE mg/dL   Hgb urine dipstick TRACE (A) NEGATIVE   Bilirubin Urine NEGATIVE NEGATIVE   Ketones, ur NEGATIVE NEGATIVE mg/dL   Protein, ur NEGATIVE NEGATIVE mg/dL   Nitrite NEGATIVE NEGATIVE   Leukocytes, UA NEGATIVE NEGATIVE  Urine microscopic-add on     Status: Abnormal   Collection Time: 12/17/15 12:35 PM  Result Value Ref Range   Squamous Epithelial / LPF 0-5 (A) NONE SEEN    WBC, UA 0-5 0 - 5 WBC/hpf   RBC / HPF 0-5 0 - 5 RBC/hpf   Bacteria, UA RARE (A) NONE SEEN   Casts HYALINE CASTS (A) NEGATIVE  Troponin I     Status: Abnormal   Collection Time: 12/17/15  1:37 PM  Result Value Ref Range   Troponin I 0.29 (H) <0.031 ng/mL    Comment:        PERSISTENTLY INCREASED TROPONIN VALUES IN THE RANGE OF 0.04-0.49 ng/mL CAN BE SEEN IN:       -UNSTABLE ANGINA       -CONGESTIVE HEART FAILURE       -MYOCARDITIS       -CHEST TRAUMA       -ARRYHTHMIAS       -LATE PRESENTING MYOCARDIAL INFARCTION       -COPD   CLINICAL FOLLOW-UP RECOMMENDED.   TSH     Status: None   Collection Time: 12/17/15  1:37 PM  Result Value Ref Range   TSH 2.442 0.350 - 4.500 uIU/mL  D-dimer, quantitative (not at [facility])     Status: Abnormal   Collection Time: 12/17/15  1:37 PM  Result Value Ref Range   D-Dimer, Quant 16.00 (H) 0.00 - 0.50 ug/mL-FEU    Comment: (NOTE) At the manufacturer cut-off of 0.50 ug/mL FEU, this assay has been documented to exclude PE with a sensitivity and negative predictive value of 97 to 99%.  At this time, this assay has not been approved by the FDA to exclude DVT/VTE. Results should be correlated with clinical presentation.   Troponin I     Status: Abnormal   Collection Time: 12/17/15  7:34 PM  Result Value Ref Range   Troponin I 0.39 (H) <0.031 ng/mL    Comment:        PERSISTENTLY INCREASED TROPONIN VALUES IN THE RANGE OF 0.04-0.49 ng/mL CAN BE SEEN IN:       -UNSTABLE ANGINA       -CONGESTIVE HEART FAILURE       -MYOCARDITIS       -CHEST TRAUMA       -ARRYHTHMIAS       -  LATE PRESENTING MYOCARDIAL INFARCTION       -COPD   CLINICAL FOLLOW-UP RECOMMENDED.   Heparin level (unfractionated)     Status: Abnormal   Collection Time: 12/17/15 11:23 PM  Result Value Ref Range   Heparin Unfractionated 1.02 (H) 0.30 - 0.70 IU/mL    Comment:        IF HEPARIN RESULTS ARE BELOW EXPECTED VALUES, AND PATIENT DOSAGE HAS BEEN CONFIRMED, SUGGEST FOLLOW  UP TESTING OF ANTITHROMBIN III LEVELS.   Basic metabolic panel     Status: Abnormal   Collection Time: 12/18/15  2:05 AM  Result Value Ref Range   Sodium 134 (L) 135 - 145 mmol/L   Potassium 4.2 3.5 - 5.1 mmol/L   Chloride 108 101 - 111 mmol/L   CO2 19 (L) 22 - 32 mmol/L   Glucose, Bld 134 (H) 65 - 99 mg/dL   BUN 21 (H) 6 - 20 mg/dL   Creatinine, Ser 1.42 (H) 0.61 - 1.24 mg/dL   Calcium 8.5 (L) 8.9 - 10.3 mg/dL   GFR calc non Af Amer 46 (L) >60 mL/min   GFR calc Af Amer 53 (L) >60 mL/min    Comment: (NOTE) The eGFR has been calculated using the CKD EPI equation. This calculation has not been validated in all clinical situations. eGFR's persistently <60 mL/min signify possible Chronic Kidney Disease.    Anion gap 7 5 - 15  CBC     Status: Abnormal   Collection Time: 12/18/15  2:05 AM  Result Value Ref Range   WBC 11.4 (H) 4.0 - 10.5 K/uL   RBC 4.54 4.22 - 5.81 MIL/uL   Hemoglobin 13.7 13.0 - 17.0 g/dL   HCT 40.2 39.0 - 52.0 %   MCV 88.5 78.0 - 100.0 fL   MCH 30.2 26.0 - 34.0 pg   MCHC 34.1 30.0 - 36.0 g/dL   RDW 12.9 11.5 - 15.5 %   Platelets 152 150 - 400 K/uL  Troponin I     Status: Abnormal   Collection Time: 12/18/15  2:05 AM  Result Value Ref Range   Troponin I 0.31 (H) <0.031 ng/mL    Comment:        PERSISTENTLY INCREASED TROPONIN VALUES IN THE RANGE OF 0.04-0.49 ng/mL CAN BE SEEN IN:       -UNSTABLE ANGINA       -CONGESTIVE HEART FAILURE       -MYOCARDITIS       -CHEST TRAUMA       -ARRYHTHMIAS       -LATE PRESENTING MYOCARDIAL INFARCTION       -COPD   CLINICAL FOLLOW-UP RECOMMENDED.   Heparin level (unfractionated)     Status: Abnormal   Collection Time: 12/18/15  7:55 AM  Result Value Ref Range   Heparin Unfractionated 0.71 (H) 0.30 - 0.70 IU/mL    Comment:        IF HEPARIN RESULTS ARE BELOW EXPECTED VALUES, AND PATIENT DOSAGE HAS BEEN CONFIRMED, SUGGEST FOLLOW UP TESTING OF ANTITHROMBIN III LEVELS.   Heparin level (unfractionated)     Status:  None   Collection Time: 12/18/15  2:25 PM  Result Value Ref Range   Heparin Unfractionated 0.51 0.30 - 0.70 IU/mL    Comment:        IF HEPARIN RESULTS ARE BELOW EXPECTED VALUES, AND PATIENT DOSAGE HAS BEEN CONFIRMED, SUGGEST FOLLOW UP TESTING OF ANTITHROMBIN III LEVELS.   CBC     Status: Abnormal   Collection Time: 12/19/15  4:49  AM  Result Value Ref Range   WBC 8.5 4.0 - 10.5 K/uL   RBC 4.07 (L) 4.22 - 5.81 MIL/uL   Hemoglobin 12.4 (L) 13.0 - 17.0 g/dL   HCT 36.2 (L) 39.0 - 52.0 %   MCV 88.9 78.0 - 100.0 fL   MCH 30.5 26.0 - 34.0 pg   MCHC 34.3 30.0 - 36.0 g/dL   RDW 13.0 11.5 - 15.5 %   Platelets 136 (L) 150 - 400 K/uL  Heparin level (unfractionated)     Status: None   Collection Time: 12/19/15  4:49 AM  Result Value Ref Range   Heparin Unfractionated 0.33 0.30 - 0.70 IU/mL    Comment:        IF HEPARIN RESULTS ARE BELOW EXPECTED VALUES, AND PATIENT DOSAGE HAS BEEN CONFIRMED, SUGGEST FOLLOW UP TESTING OF ANTITHROMBIN III LEVELS.     ABGS No results for input(s): PHART, PO2ART, TCO2, HCO3 in the last 72 hours.  Invalid input(s): PCO2 CULTURES No results found for this or any previous visit (from the past 240 hour(s)). Studies/Results: Ct Head Wo Contrast  12/17/2015  CLINICAL DATA:  Syncope this morning with a blow to the forehead. Initial encounter. EXAM: CT HEAD WITHOUT CONTRAST TECHNIQUE: Contiguous axial images were obtained from the base of the skull through the vertex without intravenous contrast. COMPARISON:  None. FINDINGS: The brain is atrophic with extensive chronic microvascular ischemic change. No evidence of acute intracranial abnormality including hemorrhage, infarct, mass lesion, mass effect, midline shift or abnormal extra-axial fluid collection is seen. There is no hydrocephalus or pneumocephalus. The calvarium is intact. Imaged paranasal sinuses and mastoid air cells are clear. IMPRESSION: No acute abnormality. Electronically Signed   By: Inge Rise M.D.   On: 12/17/2015 11:18   Ct Angio Chest Pe W/cm &/or Wo Cm  12/17/2015  CLINICAL DATA:  Syncopal episode today with chest pain and shortness of breath EXAM: CT ANGIOGRAPHY CHEST WITH CONTRAST TECHNIQUE: Multidetector CT imaging of the chest was performed using the standard protocol during bolus administration of intravenous contrast. Multiplanar CT image reconstructions and MIPs were obtained to evaluate the vascular anatomy. CONTRAST:  135m OMNIPAQUE IOHEXOL 350 MG/ML SOLN COMPARISON:  None. FINDINGS: Lungs are well aerated bilaterally. No focal confluent infiltrate is seen. Small left-sided pleural effusion is noted. The thoracic inlet is within normal limits. The thoracic aorta and its branches show mild atherosclerotic calcification without aneurysmal dilatation or dissection. The pulmonary artery is well visualized and demonstrates significant bilateral pulmonary emboli as well as a central saddle embolus. The RV LV ratio measures 1.1 indicating a degree of right heart strain. No hilar or mediastinal adenopathy is seen. The visualized upper abdomen is within normal limits. No acute bony abnormality is noted. Review of the MIP images confirms the above findings. IMPRESSION: Positive for acute PE with CT evidence of right heart strain (RV/LV Ratio = 1.1) consistent with at least submassive (intermediate risk) PE. The presence of right heart strain has been associated with an increased risk of morbidity and mortality. Please activate Code PE by paging [phone] Small left pleural effusion Critical Value/emergent results were called by telephone at the time of interpretation on 12/17/2015 at 8:04 pm to Dr. LDarrick Meigs who verbally acknowledged these results. Electronically Signed   By: MInez CatalinaM.D.   On: 12/17/2015 20:12   [place]Venous Img Lower Bilateral  12/18/2015  CLINICAL DATA:  Significant PE with right heart strain EXAM: BILATERAL LOWER EXTREMITY VENOUS DOPPLER ULTRASOUND TECHNIQUE:  Gray-scale sonography  with graded compression, as well as color Doppler and duplex ultrasound were performed to evaluate the lower extremity deep venous systems from the level of the common femoral vein and including the common femoral, femoral, profunda femoral, popliteal and calf veins including the posterior tibial, peroneal and gastrocnemius veins when visible. The superficial great saphenous vein was also interrogated. Spectral Doppler was utilized to evaluate flow at rest and with distal augmentation maneuvers in the common femoral, femoral and popliteal veins. COMPARISON:  None. FINDINGS: RIGHT LOWER EXTREMITY Common Femoral Vein: No evidence of thrombus. Normal compressibility, respiratory phasicity and response to augmentation. Saphenofemoral Junction: No evidence of thrombus. Normal compressibility and flow on color Doppler imaging. Profunda Femoral Vein: No evidence of thrombus. Normal compressibility and flow on color Doppler imaging. Femoral Vein: No evidence of thrombus. Normal compressibility, respiratory phasicity and response to augmentation. Popliteal Vein: No evidence of thrombus. Normal compressibility, respiratory phasicity and response to augmentation. Calf Veins: No evidence of thrombus. Normal compressibility and flow on color Doppler imaging. Superficial Great Saphenous Vein: No evidence of thrombus. Normal compressibility and flow on color Doppler imaging. Venous Reflux:  None. Other Findings:  None. LEFT LOWER EXTREMITY Common Femoral Vein: Nonocclusive thrombus is noted. Decreased compressibility is seen. Saphenofemoral Junction: No evidence of thrombus. Normal compressibility and flow on color Doppler imaging. Profunda Femoral Vein: Nonocclusive thrombus is noted. Decreased compressibility is noted. Femoral Vein: No evidence of thrombus. Normal compressibility, respiratory phasicity and response to augmentation. Popliteal Vein: No evidence of thrombus. Normal compressibility, respiratory  phasicity and response to augmentation. Calf Veins: Nonocclusive thrombus is noted in the posterior tibial and peroneal veins. Decreased compressibility is noted. Superficial Great Saphenous Vein: No evidence of thrombus. Normal compressibility and flow on color Doppler imaging. Venous Reflux:  None. Other Findings:  None. IMPRESSION: No evidence of deep venous thrombosis on the right. Multifocal areas of deep venous thrombosis on the left. Electronically Signed   By: Inez Catalina M.D.   On: 12/18/2015 13:04   Dg Chest Port 1 View  12/17/2015  CLINICAL DATA:  Left chest pain EXAM: PORTABLE CHEST 1 VIEW COMPARISON:  05/05/2005 FINDINGS: Heart size is upper normal.  Negative for heart failure. Lungs are clear without infiltrate or effusion. Negative for mass lesion. No interval change. IMPRESSION: No active disease. Electronically Signed   By: Franchot Gallo M.D.   On: 12/17/2015 10:09    Medications:  Prior to Admission:  Prescriptions prior to admission  Medication Sig Dispense Refill Last Dose  . acetaminophen (TYLENOL) 325 MG tablet Take 650 mg by mouth every 6 (six) hours as needed.   12/17/2015 at 830  . cetirizine (ZYRTEC ALLERGY) 10 MG tablet Take 10 mg by mouth daily.   12/16/2015 at Unknown time  . Coenzyme Q10 (CO Q-10) 100 MG CAPS Take 1 tablet by mouth daily.   12/16/2015 at Unknown time  . guaiFENesin-dextromethorphan (ROBITUSSIN DM) 100-10 MG/5ML syrup Take 15 mLs by mouth every 4 (four) hours as needed for cough.   12/16/2015 at Unknown time  . Multiple Vitamins-Minerals (CENTRUM ADULTS) TABS Take 1 tablet by mouth daily.   12/16/2015 at Unknown time  . Multiple Vitamins-Minerals (MULTIVITAMIN WITH IRON-MINERALS) liquid Take by mouth daily.     . ranitidine (ZANTAC) 150 MG tablet Take 150 mg by mouth 2 (two) times daily.   12/16/2015 at Unknown time  . simvastatin (ZOCOR) 40 MG tablet Take 40 mg by mouth daily.   12/16/2015 at 2330   Scheduled: . aspirin EC  325  mg Oral Daily  .  famotidine  20 mg Oral BID  . pantoprazole  40 mg Oral Daily  . simvastatin  40 mg Oral Daily  . sodium chloride  3 mL Intravenous Q12H   Continuous: . sodium chloride 100 mL/hr at 12/19/15 0410  . heparin 1,050 Units/hr (12/19/15 0410)   SSQ:[id number] **OR** acetaminophen, alum & mag hydroxide-simeth, fentaNYL (SUBLIMAZE) injection, nitroGLYCERIN, ondansetron **OR** ondansetron (ZOFRAN) IV  Assesment: He was admitted with submassive pulmonary embolism. He had shows evidence of right heart strain on both his CT and echocardiogram. He is hemodynamically stable but this is a special situation requiring different treatment then standard treatment for pulmonary embolism. He will need 5 days of IV anticoagulation and then transition to oral, potentially one of the novel oral agents. I think he will require lifelong anticoagulation. He will need another echocardiogram later potentially as an outpatient to see what's happened with his right heart.   Principal Problem:   Syncope and collapse Active Problems:   Chest pain   HLD (hyperlipidemia)   GERD (gastroesophageal reflux disease)   Acute pulmonary embolism (HCC)   Dyspnea   Renal insufficiency 3- unclear duration   Shortness of breath    Plan: No change in treatments.    LOS: 1 day   Solimar Maiden L 12/19/2015, 7:17 AM

## 2015-12-20 ENCOUNTER — Inpatient Hospital Stay (HOSPITAL_COMMUNITY): Payer: Medicare Other

## 2015-12-20 LAB — CBC
HCT: 36.6 % — ABNORMAL LOW (ref 39.0–52.0)
HEMOGLOBIN: 12.4 g/dL — AB (ref 13.0–17.0)
MCH: 29.8 pg (ref 26.0–34.0)
MCHC: 33.9 g/dL (ref 30.0–36.0)
MCV: 88 fL (ref 78.0–100.0)
Platelets: 147 10*3/uL — ABNORMAL LOW (ref 150–400)
RBC: 4.16 MIL/uL — AB (ref 4.22–5.81)
RDW: 12.9 % (ref 11.5–15.5)
WBC: 9.5 10*3/uL (ref 4.0–10.5)

## 2015-12-20 LAB — HEPARIN LEVEL (UNFRACTIONATED): HEPARIN UNFRACTIONATED: 0.61 [IU]/mL (ref 0.30–0.70)

## 2015-12-20 LAB — HOMOCYSTEINE: HOMOCYSTEINE-NORM: 8.2 umol/L (ref 0.0–15.0)

## 2015-12-20 NOTE — Progress Notes (Signed)
Pt a/o.vss. IV patent. No complaints of any distress. Report given to Anderson Malta, Therapist, sports. Pt to be transferred to room 326 via wheelchair with nursing staff.

## 2015-12-20 NOTE — Progress Notes (Signed)
Subjective: He says he feels okay. He is still short of breath with minimal exertion.  Objective: Vital signs in last 24 hours: Temp:  [97 F (36.1 C)-98.2 F (36.8 C)] 97.4 F (36.3 C) (12/23 0400) Pulse Rate:  [71-99] 83 (12/23 0700) Resp:  [16-25] 24 (12/23 0700) BP: (120-163)/(74-136) 162/80 mmHg (12/23 0700) SpO2:  [93 %-98 %] 97 % (12/23 0700) Weight:  [83.5 kg (184 lb 1.4 oz)] 83.5 kg (184 lb 1.4 oz) (12/23 0500) Weight change: 0.6 kg (1 lb 5.2 oz) Last BM Date: 12/17/15  Intake/Output from previous day: 12/22 0701 - 12/23 0700 In: 4212 [P.O.:1560; I.V.:2652] Out: 4225 [Urine:4225]  PHYSICAL EXAM General appearance: alert, cooperative and no distress Resp: clear to auscultation bilaterally Cardio: regular rate and rhythm, S1, S2 normal, no murmur, click, rub or gallop GI: soft, non-tender; bowel sounds normal; no masses,  no organomegaly Extremities: extremities normal, atraumatic, no cyanosis or edema  Lab Results:  Results for orders placed or performed during the hospital encounter of 12/17/15 (from the past 48 hour(s))  Heparin level (unfractionated)     Status: Abnormal   Collection Time: 12/18/15  7:55 AM  Result Value Ref Range   Heparin Unfractionated 0.71 (H) 0.30 - 0.70 IU/mL    Comment:        IF HEPARIN RESULTS ARE BELOW EXPECTED VALUES, AND PATIENT DOSAGE HAS BEEN CONFIRMED, SUGGEST FOLLOW UP TESTING OF ANTITHROMBIN III LEVELS.   Heparin level (unfractionated)     Status: None   Collection Time: 12/18/15  2:25 PM  Result Value Ref Range   Heparin Unfractionated 0.51 0.30 - 0.70 IU/mL    Comment:        IF HEPARIN RESULTS ARE BELOW EXPECTED VALUES, AND PATIENT DOSAGE HAS BEEN CONFIRMED, SUGGEST FOLLOW UP TESTING OF ANTITHROMBIN III LEVELS.   CBC     Status: Abnormal   Collection Time: 12/19/15  4:49 AM  Result Value Ref Range   WBC 8.5 4.0 - 10.5 K/uL   RBC 4.07 (L) 4.22 - 5.81 MIL/uL   Hemoglobin 12.4 (L) 13.0 - 17.0 g/dL   HCT 36.2 (L)  39.0 - 52.0 %   MCV 88.9 78.0 - 100.0 fL   MCH 30.5 26.0 - 34.0 pg   MCHC 34.3 30.0 - 36.0 g/dL   RDW 13.0 11.5 - 15.5 %   Platelets 136 (L) 150 - 400 K/uL  Heparin level (unfractionated)     Status: None   Collection Time: 12/19/15  4:49 AM  Result Value Ref Range   Heparin Unfractionated 0.33 0.30 - 0.70 IU/mL    Comment:        IF HEPARIN RESULTS ARE BELOW EXPECTED VALUES, AND PATIENT DOSAGE HAS BEEN CONFIRMED, SUGGEST FOLLOW UP TESTING OF ANTITHROMBIN III LEVELS.   Antithrombin III     Status: Abnormal   Collection Time: 12/19/15  4:49 AM  Result Value Ref Range   AntiThromb III Func 66 (L) 75 - 120 %    Comment: Performed at [facility]  MRSA PCR Screening     Status: None   Collection Time: 12/19/15 12:15 PM  Result Value Ref Range   MRSA by PCR NEGATIVE NEGATIVE    Comment:        The GeneXpert MRSA Assay (FDA approved for NASAL specimens only), is one component of a comprehensive MRSA colonization surveillance program. It is not intended to diagnose MRSA infection nor to guide or monitor treatment for MRSA infections.   CBC  Status: Abnormal   Collection Time: 12/20/15  4:35 AM  Result Value Ref Range   WBC 9.5 4.0 - 10.5 K/uL   RBC 4.16 (L) 4.22 - 5.81 MIL/uL   Hemoglobin 12.4 (L) 13.0 - 17.0 g/dL   HCT 36.6 (L) 39.0 - 52.0 %   MCV 88.0 78.0 - 100.0 fL   MCH 29.8 26.0 - 34.0 pg   MCHC 33.9 30.0 - 36.0 g/dL   RDW 12.9 11.5 - 15.5 %   Platelets 147 (L) 150 - 400 K/uL  Heparin level (unfractionated)     Status: None   Collection Time: 12/20/15  4:35 AM  Result Value Ref Range   Heparin Unfractionated 0.61 0.30 - 0.70 IU/mL    Comment:        IF HEPARIN RESULTS ARE BELOW EXPECTED VALUES, AND PATIENT DOSAGE HAS BEEN CONFIRMED, SUGGEST FOLLOW UP TESTING OF ANTITHROMBIN III LEVELS.     ABGS No results for input(s): PHART, PO2ART, TCO2, HCO3 in the last 72 hours.  Invalid input(s): PCO2 CULTURES Recent Results (from the past 240 hour(s))   MRSA PCR Screening     Status: None   Collection Time: 12/19/15 12:15 PM  Result Value Ref Range Status   MRSA by PCR NEGATIVE NEGATIVE Final    Comment:        The GeneXpert MRSA Assay (FDA approved for NASAL specimens only), is one component of a comprehensive MRSA colonization surveillance program. It is not intended to diagnose MRSA infection nor to guide or monitor treatment for MRSA infections.    Studies/Results: US Venous Img Lower Bilateral  12/18/2015  CLINICAL DATA:  Significant PE with right heart strain EXAM: BILATERAL LOWER EXTREMITY VENOUS DOPPLER ULTRASOUND TECHNIQUE: Gray-scale sonography with graded compression, as well as color Doppler and duplex ultrasound were performed to evaluate the lower extremity deep venous systems from the level of the common femoral vein and including the common femoral, femoral, profunda femoral, popliteal and calf veins including the posterior tibial, peroneal and gastrocnemius veins when visible. The superficial great saphenous vein was also interrogated. Spectral Doppler was utilized to evaluate flow at rest and with distal augmentation maneuvers in the common femoral, femoral and popliteal veins. COMPARISON:  None. FINDINGS: RIGHT LOWER EXTREMITY Common Femoral Vein: No evidence of thrombus. Normal compressibility, respiratory phasicity and response to augmentation. Saphenofemoral Junction: No evidence of thrombus. Normal compressibility and flow on color Doppler imaging. Profunda Femoral Vein: No evidence of thrombus. Normal compressibility and flow on color Doppler imaging. Femoral Vein: No evidence of thrombus. Normal compressibility, respiratory phasicity and response to augmentation. Popliteal Vein: No evidence of thrombus. Normal compressibility, respiratory phasicity and response to augmentation. Calf Veins: No evidence of thrombus. Normal compressibility and flow on color Doppler imaging. Superficial Great Saphenous Vein: No evidence of  thrombus. Normal compressibility and flow on color Doppler imaging. Venous Reflux:  None. Other Findings:  None. LEFT LOWER EXTREMITY Common Femoral Vein: Nonocclusive thrombus is noted. Decreased compressibility is seen. Saphenofemoral Junction: No evidence of thrombus. Normal compressibility and flow on color Doppler imaging. Profunda Femoral Vein: Nonocclusive thrombus is noted. Decreased compressibility is noted. Femoral Vein: No evidence of thrombus. Normal compressibility, respiratory phasicity and response to augmentation. Popliteal Vein: No evidence of thrombus. Normal compressibility, respiratory phasicity and response to augmentation. Calf Veins: Nonocclusive thrombus is noted in the posterior tibial and peroneal veins. Decreased compressibility is noted. Superficial Great Saphenous Vein: No evidence of thrombus. Normal compressibility and flow on color Doppler imaging. Venous Reflux:  None. Other  Findings:  None. IMPRESSION: No evidence of deep venous thrombosis on the right. Multifocal areas of deep venous thrombosis on the left. Electronically Signed   By: Inez Catalina M.D.   On: 12/18/2015 13:04    Medications:  Prior to Admission:  Prescriptions prior to admission  Medication Sig Dispense Refill Last Dose  . acetaminophen (TYLENOL) 325 MG tablet Take 650 mg by mouth every 6 (six) hours as needed.   12/17/2015 at 830  . cetirizine (ZYRTEC ALLERGY) 10 MG tablet Take 10 mg by mouth daily.   12/16/2015 at Unknown time  . Coenzyme Q10 (CO Q-10) 100 MG CAPS Take 1 tablet by mouth daily.   12/16/2015 at Unknown time  . guaiFENesin-dextromethorphan (ROBITUSSIN DM) 100-10 MG/5ML syrup Take 15 mLs by mouth every 4 (four) hours as needed for cough.   12/16/2015 at Unknown time  . Multiple Vitamins-Minerals (CENTRUM ADULTS) TABS Take 1 tablet by mouth daily.   12/16/2015 at Unknown time  . Multiple Vitamins-Minerals (MULTIVITAMIN WITH IRON-MINERALS) liquid Take by mouth daily.     . ranitidine  (ZANTAC) 150 MG tablet Take 150 mg by mouth 2 (two) times daily.   12/16/2015 at Unknown time  . simvastatin (ZOCOR) 40 MG tablet Take 40 mg by mouth daily.   12/16/2015 at 2330   Scheduled: . aspirin EC  325 mg Oral Daily  . famotidine  20 mg Oral BID  . lisinopril  2.5 mg Oral Daily  . pantoprazole  40 mg Oral Daily  . simvastatin  40 mg Oral Daily  . sodium chloride  3 mL Intravenous Q12H   Continuous: . sodium chloride 100 mL/hr at 12/20/15 0700  . heparin 1,050 Units/hr (12/20/15 0700)   [id number] **OR** acetaminophen, alum & mag hydroxide-simeth, fentaNYL (SUBLIMAZE) injection, nitroGLYCERIN, ondansetron **OR** ondansetron (ZOFRAN) IV  Assesment: He was admitted with syncope which is due to submassive pulmonary embolism. He has evidence of right ventricular strain on CT and echocardiogram. He appears to have some hypercoagulability. Principal Problem:   Syncope and collapse Active Problems:   Chest pain   HLD (hyperlipidemia)   GERD (gastroesophageal reflux disease)   Acute pulmonary embolism (HCC)   Dyspnea   Renal insufficiency 3- unclear duration   Shortness of breath    Plan: Continue heparin total of 5 days then switch to oral agent.    LOS: 2 days   Rondey Fallen L 12/20/2015, 7:31 AM

## 2015-12-20 NOTE — Care Management Important Message (Signed)
Important Message  Patient Details  Name: Jacob Bauer MRN: [id number] Date of Birth: May 24, 1935   Medicare Important Message Given:  Yes    Sherald Barge, RN 12/20/2015, 9:02 AM

## 2015-12-20 NOTE — Progress Notes (Signed)
Non-occlusive thrombus visualized in left side deep femoral Vein as well as popliteal system. None seen on the right patient continues on IV heparin antithrombin III diminished at 67 patient will be transitioned to oral anticoagulation as hemodynamic stability continues Jacob Bauer [id number] DOB: 07-10-1935 DOA: 12/17/2015 PCP: Lanette Hampshire, MD             Physical Exam: Blood pressure 120/74, pulse 71, temperature 97.4 F (36.3 C), temperature source Oral, resp. rate 17, height 5\' 7"  (1.702 m), weight 184 lb 1.4 oz (83.5 kg), SpO2 97 %. lungs diminished breath sounds in bases no rales wheeze rhonchi appreciable heart regular rhythm no S3-S4 no heaves thrills rubs abdomen soft nontender bowel sounds normoactive Homans sign negative no evidence of palpable cord visualized   Investigations:  Recent Results (from the past 240 hour(s))  MRSA PCR Screening     Status: None   Collection Time: 12/19/15 12:15 PM  Result Value Ref Range Status   MRSA by PCR NEGATIVE NEGATIVE Final    Comment:        The GeneXpert MRSA Assay (FDA approved for NASAL specimens only), is one component of a comprehensive MRSA colonization surveillance program. It is not intended to diagnose MRSA infection nor to guide or monitor treatment for MRSA infections.      Basic Metabolic Panel:  Recent Labs  12/17/15 0956 12/18/15 0205  NA 138 134*  K 3.9 4.2  CL 105 108  CO2 24 19*  GLUCOSE 171* 134*  BUN 19 21*  CREATININE 1.40* 1.42*  CALCIUM 9.1 8.5*   Liver Function Tests:  Recent Labs  12/17/15 0956  AST 21  ALT 19  ALKPHOS 66  BILITOT 0.6  PROT 7.2  ALBUMIN 3.3*     CBC:  Recent Labs  12/17/15 0956  12/19/15 0449 12/20/15 0435  WBC 11.6*  < > 8.5 9.5  NEUTROABS 8.7*  --   --   --   HGB 14.2  < > 12.4* 12.4*  HCT 41.9  < > 36.2* 36.6*  MCV 90.3  < > 88.9 88.0  PLT 144*  < > 136* 147*  < > = values in this interval not displayed.  US Venous Img Lower  Bilateral  12/18/2015  CLINICAL DATA:  Significant PE with right heart strain EXAM: BILATERAL LOWER EXTREMITY VENOUS DOPPLER ULTRASOUND TECHNIQUE: Gray-scale sonography with graded compression, as well as color Doppler and duplex ultrasound were performed to evaluate the lower extremity deep venous systems from the level of the common femoral vein and including the common femoral, femoral, profunda femoral, popliteal and calf veins including the posterior tibial, peroneal and gastrocnemius veins when visible. The superficial great saphenous vein was also interrogated. Spectral Doppler was utilized to evaluate flow at rest and with distal augmentation maneuvers in the common femoral, femoral and popliteal veins. COMPARISON:  None. FINDINGS: RIGHT LOWER EXTREMITY Common Femoral Vein: No evidence of thrombus. Normal compressibility, respiratory phasicity and response to augmentation. Saphenofemoral Junction: No evidence of thrombus. Normal compressibility and flow on color Doppler imaging. Profunda Femoral Vein: No evidence of thrombus. Normal compressibility and flow on color Doppler imaging. Femoral Vein: No evidence of thrombus. Normal compressibility, respiratory phasicity and response to augmentation. Popliteal Vein: No evidence of thrombus. Normal compressibility, respiratory phasicity and response to augmentation. Calf Veins: No evidence of thrombus. Normal compressibility and flow on color Doppler imaging. Superficial Great Saphenous Vein: No evidence of thrombus. Normal compressibility and flow on color Doppler imaging. Venous Reflux:  None.  Other Findings:  None. LEFT LOWER EXTREMITY Common Femoral Vein: Nonocclusive thrombus is noted. Decreased compressibility is seen. Saphenofemoral Junction: No evidence of thrombus. Normal compressibility and flow on color Doppler imaging. Profunda Femoral Vein: Nonocclusive thrombus is noted. Decreased compressibility is noted. Femoral Vein: No evidence of thrombus.  Normal compressibility, respiratory phasicity and response to augmentation. Popliteal Vein: No evidence of thrombus. Normal compressibility, respiratory phasicity and response to augmentation. Calf Veins: Nonocclusive thrombus is noted in the posterior tibial and peroneal veins. Decreased compressibility is noted. Superficial Great Saphenous Vein: No evidence of thrombus. Normal compressibility and flow on color Doppler imaging. Venous Reflux:  None. Other Findings:  None. IMPRESSION: No evidence of deep venous thrombosis on the right. Multifocal areas of deep venous thrombosis on the left. Electronically Signed   By: Inez Catalina M.D.   On: 12/18/2015 13:04      Medications:   Impression: Deep venous thrombosis  Principal Problem:   Syncope and collapse Active Problems:   Chest pain   HLD (hyperlipidemia)   GERD (gastroesophageal reflux disease)   Acute pulmonary embolism (HCC)   Dyspnea   Renal insufficiency 3- unclear duration   Shortness of breath     Plan: Continue IV heparin is hemodynamically stability continues we'll switch to Xarelto or Eliquis. Awaiting  further results of thrombotic workup   Consultants: Cardiology and pulmonology   Procedures   Antibiotics:                   Code Status:   Family Communication:    Disposition Plan see plan above  Time spent: 30 minutes   LOS: 2 days   Jacob Bauer M   12/20/2015, 6:46 AM

## 2015-12-20 NOTE — Progress Notes (Signed)
Patient arrived to floor with wife at bedside. Telemetry applied. Patient has no complaints at this time.

## 2015-12-20 NOTE — Progress Notes (Signed)
[place] for heparin Indication: pulmonary embolus  Allergies  Allergen Reactions  . Bee Venom   . Penicillins Rash   Patient Measurements: Height: 5\' 7"  (170.2 cm) Weight: 184 lb 1.4 oz (83.5 kg) IBW/kg (Calculated) : 66.1 Heparin Dosing weight: 80   Vital Signs: Temp: 97.4 F (36.3 C) (12/23 0400) Temp Source: Oral (12/23 0400) BP: 162/80 mmHg (12/23 0700) Pulse Rate: 83 (12/23 0700)  Labs:  Recent Labs  12/17/15 0956 12/17/15 1337 12/17/15 1934  12/18/15 0205  12/18/15 1425 12/19/15 0449 12/20/15 0435  HGB 14.2  --   --   --  13.7  --   --  12.4* 12.4*  HCT 41.9  --   --   --  40.2  --   --  36.2* 36.6*  PLT 144*  --   --   --  152  --   --  136* 147*  HEPARINUNFRC  --   --   --   < >  --   < > 0.51 0.33 0.61  CREATININE 1.40*  --   --   --  1.42*  --   --   --   --   TROPONINI 0.05* 0.29* 0.39*  --  0.31*  --   --   --   --   < > = values in this interval not displayed. Estimated Creatinine Clearance: 44.3 mL/min (by C-G formula based on Cr of 1.42).  Medical History: Past Medical History  Diagnosis Date  . High cholesterol   . Reflux    Medications:  See medication history  Assessment: 80 yo man on heparin for PE. Initial heparin level was above goal initially but has now trended down to target range and is therapeutic x 4 consecutive checks.   No bleeding reported, CBC OK.  Goal of Therapy:  Heparin level 0.3-0.7 units/ml Monitor platelets by anticoagulation protocol: Yes   Plan:  Continue heparin drip at 1050 units/hr (anticipate 5 days total Rx) Check heparin level daily CBC daily while on Heparin Monitor for bleeding complications  Thanks for allowing pharmacy to be a part of this patient's care.  Hart Robinsons, PharmD Clinical Pharmacist 12/20/2015,8:26 AM

## 2015-12-21 LAB — CBC
HCT: 36.3 % — ABNORMAL LOW (ref 39.0–52.0)
HEMOGLOBIN: 12.7 g/dL — AB (ref 13.0–17.0)
MCH: 30.5 pg (ref 26.0–34.0)
MCHC: 35 g/dL (ref 30.0–36.0)
MCV: 87.1 fL (ref 78.0–100.0)
Platelets: 157 10*3/uL (ref 150–400)
RBC: 4.17 MIL/uL — ABNORMAL LOW (ref 4.22–5.81)
RDW: 12.8 % (ref 11.5–15.5)
WBC: 8.9 10*3/uL (ref 4.0–10.5)

## 2015-12-21 LAB — DRVVT MIX: DRVVT MIX: 41.8 s (ref 0.0–44.0)

## 2015-12-21 LAB — HEPARIN LEVEL (UNFRACTIONATED)
HEPARIN UNFRACTIONATED: 0.84 [IU]/mL — AB (ref 0.30–0.70)
HEPARIN UNFRACTIONATED: 0.45 [IU]/mL (ref 0.30–0.70)

## 2015-12-21 LAB — PROTEIN C ACTIVITY: PROTEIN C ACTIVITY: 86 % (ref 73–180)

## 2015-12-21 LAB — BETA-2-GLYCOPROTEIN I ABS, IGG/M/A: Beta-2 Glyco I IgG: <9 GPI IgG units (ref 0–20)

## 2015-12-21 LAB — LUPUS ANTICOAGULANT PANEL
DRVVT: 46.2 s — AB (ref 0.0–44.0)
PTT Lupus Anticoagulant: 51.3 s — ABNORMAL HIGH (ref 0.0–40.6)

## 2015-12-21 LAB — PTT-LA MIX: PTT-LA Mix: 46 s — ABNORMAL HIGH (ref 0.0–40.6)

## 2015-12-21 LAB — PROTEIN S, TOTAL: Protein S Ag, Total: 130 % (ref 60–150)

## 2015-12-21 LAB — HEXAGONAL PHASE PHOSPHOLIPID: Hexagonal Phase Phospholipid: 8 s (ref 0–11)

## 2015-12-21 LAB — PROTEIN S ACTIVITY: Protein S Activity: 94 % (ref 63–140)

## 2015-12-21 MED ORDER — APIXABAN 5 MG PO TABS: 10.0000 mg | ORAL_TABLET | Freq: Two times a day (BID) | ORAL | Status: DC

## 2015-12-21 MED ORDER — APIXABAN 5 MG PO TABS: 5.0000 mg | ORAL_TABLET | Freq: Two times a day (BID) | ORAL | Status: DC

## 2015-12-21 MED ORDER — HEPARIN (PORCINE) IN NACL 100-0.45 UNIT/ML-% IJ SOLN
900.0000 [IU]/h | INTRAMUSCULAR | Status: AC
  Administered 2015-12-22: 900 [IU]/h via INTRAVENOUS
  Filled 2015-12-21: qty 250

## 2015-12-21 NOTE — Progress Notes (Signed)
[place] for heparin Indication: pulmonary embolus  Allergies  Allergen Reactions  . Bee Venom   . Penicillins Rash   Patient Measurements: Height: 5\' 7"  (170.2 cm) Weight: 184 lb (83.462 kg) IBW/kg (Calculated) : 66.1 Heparin Dosing weight: 80   Vital Signs: Temp: 98 F (36.7 C) (12/24 1447) Temp Source: Oral (12/24 1447) BP: 150/75 mmHg (12/24 1447) Pulse Rate: 86 (12/24 1447)  Labs:  Recent Labs  12/19/15 0449 12/20/15 0435 12/21/15 0637 12/21/15 1652  HGB 12.4* 12.4* 12.7*  --   HCT 36.2* 36.6* 36.3*  --   PLT 136* 147* 157  --   HEPARINUNFRC 0.33 0.61 0.84* 0.45   Estimated Creatinine Clearance: 44.3 mL/min (by C-G formula based on Cr of 1.42).  Medical History: Past Medical History  Diagnosis Date  . High cholesterol   . Reflux    Medications:  See medication history  Assessment: 80 yo man on heparin for PE. Initial heparin level was above goal initially and was therapeutic x 4 consecutive checks.  No bleeding reported, CBC OK Heparin level at goal after reducing heparin rate   Goal of Therapy:  Heparin level 0.3-0.7 units/ml Monitor platelets by anticoagulation protocol: Yes   Plan:  Continue heparin drip at 900 units/hr (anticipate 5 days total Rx) Check heparin level daily CBC daily while on Heparin Monitor for bleeding complications    Abner Greenspan, Renarda Mullinix B Clinical Pharmacist 12/21/2015,5:18 PM

## 2015-12-21 NOTE — Progress Notes (Signed)
[name] for heparin Indication: pulmonary embolus  Allergies  Allergen Reactions  . Bee Venom   . Penicillins Rash   Patient Measurements: Height: 5\' 7"  (170.2 cm) Weight: 184 lb (83.462 kg) IBW/kg (Calculated) : 66.1 Heparin Dosing weight: 80   Vital Signs: Temp: 98.3 F (36.8 C) (12/24 0500) Temp Source: Oral (12/24 0500) BP: 160/88 mmHg (12/24 0500) Pulse Rate: 83 (12/24 0500)  Labs:  Recent Labs  12/19/15 0449 12/20/15 0435 12/21/15 0637  HGB 12.4* 12.4* 12.7*  HCT 36.2* 36.6* 36.3*  PLT 136* 147* 157  HEPARINUNFRC 0.33 0.61 0.84*   Estimated Creatinine Clearance: 44.3 mL/min (by C-G formula based on Cr of 1.42).  Medical History: Past Medical History  Diagnosis Date  . High cholesterol   . Reflux    Medications:  See medication history  Assessment: 80 yo man on heparin for PE. Initial heparin level was above goal initially and was therapeutic x 4 consecutive checks. However heparin level above goal this AM  No bleeding reported, CBC OK   Goal of Therapy:  Heparin level 0.3-0.7 units/ml Monitor platelets by anticoagulation protocol: Yes   Plan:  Decrease heparin drip to 900 units/hr (anticipate 5 days total Rx) Check heparin level in 8 hours and daily CBC daily while on Heparin Monitor for bleeding complications    Jacob Bauer, Jacob Bauer B Clinical Pharmacist 12/21/2015,8:33 AM

## 2015-12-21 NOTE — Progress Notes (Signed)
Patient appears hemodynamically stable no significant dyspnea sharp chest pain or cough no hemoptysis today's day 4 of IV heparin will start oral anticoagulation tomorrow on Jacob Bauer [id number] DOB: [birth year] DOA: 12/17/2015 PCP: Jacob Hampshire, MD             Physical Exam: Blood pressure 171/74, pulse 85, temperature 98.9 F (37.2 C), temperature source Oral, resp. rate 18, height 5\' 7"  (1.702 m), weight 184 lb (83.462 kg), SpO2 94 %. lungs diminished breath sounds in the bases bilaterally no rales wheeze rhonchi appreciable heart regular rhythm no S3-4 no heaves thrills rubs abdomen soft nontender bowel sounds normoactive   Investigations:  Recent Results (from the past 240 hour(s))  MRSA PCR Screening     Status: None   Collection Time: 12/19/15 12:15 PM  Result Value Ref Range Status   MRSA by PCR NEGATIVE NEGATIVE Final    Comment:        The GeneXpert MRSA Assay (FDA approved for NASAL specimens only), is one component of a comprehensive MRSA colonization surveillance program. It is not intended to diagnose MRSA infection nor to guide or monitor treatment for MRSA infections.      Basic Metabolic Panel: No results for input(s): NA, K, CL, CO2, GLUCOSE, BUN, CREATININE, CALCIUM, MG, PHOS in the last 72 hours. Liver Function Tests: No results for input(s): AST, ALT, ALKPHOS, BILITOT, PROT, ALBUMIN in the last 72 hours.   CBC:  Recent Labs  12/20/15 0435 12/21/15 0637  WBC 9.5 8.9  HGB 12.4* 12.7*  HCT 36.6* 36.3*  MCV 88.0 87.1  PLT 147* 157    No results found.    Medications:   Impression:  Principal Problem:   Syncope and collapse Active Problems:   Chest pain   HLD (hyperlipidemia)   GERD (gastroesophageal reflux disease)   Acute pulmonary embolism (HCC)   Dyspnea   Renal insufficiency 3- unclear duration   Shortness of breath     Plan: We'll start Eliquis for pulmonary embolism tomorrow then discontinue IV  heparin when therapeutic  Consultants: Pulmonary   Procedures   Antibiotics:                   Code Status: Full   Family Communication:    Disposition Plan see plan above  Time spent: 30 minutes   LOS: 3 days   Jacob Bauer M   12/21/2015, 11:22 AM

## 2015-12-21 NOTE — Progress Notes (Signed)
He is generally doing better. He has no new complaints. Plans in place for him to finish his IV anticoagulation and start oral anticoagulation. I told him I would see him in the office as an outpatient. I will plan to follow more peripherally. Allowing me to see him with you

## 2015-12-22 LAB — CBC
HEMATOCRIT: 36.2 % — AB (ref 39.0–52.0)
Hemoglobin: 12.6 g/dL — ABNORMAL LOW (ref 13.0–17.0)
MCH: 30.4 pg (ref 26.0–34.0)
MCHC: 34.8 g/dL (ref 30.0–36.0)
MCV: 87.2 fL (ref 78.0–100.0)
PLATELETS: 169 10*3/uL (ref 150–400)
RBC: 4.15 MIL/uL — AB (ref 4.22–5.81)
RDW: 13.2 % (ref 11.5–15.5)
WBC: 8.8 10*3/uL (ref 4.0–10.5)

## 2015-12-22 LAB — HEPARIN LEVEL (UNFRACTIONATED): Heparin Unfractionated: 0.44 IU/mL (ref 0.30–0.70)

## 2015-12-22 MED ORDER — APIXABAN 5 MG PO TABS
10.0000 mg | ORAL_TABLET | Freq: Two times a day (BID) | ORAL | Status: DC
  Administered 2015-12-22 – 2015-12-23 (×3): 10 mg via ORAL
  Filled 2015-12-22 (×3): qty 2

## 2015-12-22 MED ORDER — APIXABAN 5 MG PO TABS: 5.0000 mg | ORAL_TABLET | Freq: Two times a day (BID) | ORAL | Status: DC

## 2015-12-22 NOTE — Progress Notes (Signed)
ANTICOAGULATION CONSULT NOTE - Initial Consult  Pharmacy Consult for Apixaban Indication: pulmonary embolus  Allergies  Allergen Reactions  . Bee Venom   . Penicillins Rash    Patient Measurements: Height: 5\' 7"  (170.2 cm) Weight: 184 lb (83.462 kg) IBW/kg (Calculated) : 66.1 Heparin Dosing Weight: 80 kg  Vital Signs: Temp: 98 F (36.7 C) (12/25 0519) Temp Source: Oral (12/25 0519) BP: 158/71 mmHg (12/25 0519) Pulse Rate: 86 (12/25 0519)  Labs:  Recent Labs  12/20/15 0435 12/21/15 0637 12/21/15 1652 12/22/15 0445  HGB 12.4* 12.7*  --  12.6*  HCT 36.6* 36.3*  --  36.2*  PLT 147* 157  --  169  HEPARINUNFRC 0.61 0.84* 0.45 0.44    Estimated Creatinine Clearance: 44.3 mL/min (by C-G formula based on Cr of 1.42).   Medical History: Past Medical History  Diagnosis Date  . High cholesterol   . Reflux     Medications:  Scheduled:  . apixaban  10 mg Oral BID   And  . [START ON 12/29/2015] apixaban  5 mg Oral BID  . aspirin EC  325 mg Oral Daily  . famotidine  20 mg Oral BID  . lisinopril  2.5 mg Oral Daily  . pantoprazole  40 mg Oral Daily  . simvastatin  40 mg Oral Daily  . sodium chloride  3 mL Intravenous Q12H    Assessment: 80 yo man on heparin for PE for 5 days with therapeutic levels. No bleeding reported, CBC OK Pharmacy consult to change therapy to Apixaban  Goal of Therapy:  PE Treatment Dose Monitor platelets by anticoagulation protocol: Yes   Plan:  Apixaban 10 mg po twice daily for 7 days, then Apixaban 5 mg twice daily Discontinue heparin when starting Apixaban Monitor CBC, platelets, signs of bleeding   Abner Greenspan, Tajia Szeliga Bennett 12/22/2015,8:17 AM

## 2015-12-22 NOTE — Progress Notes (Signed)
He is doing well. He has no complaints. I will plan to sign off. I will see him in my office thanks for allowing me to see him with you

## 2015-12-22 NOTE — Progress Notes (Signed)
Patient started on Eliquis 10 by mouth twice a day at 10 AM today and will be discontinued if not certain the time to full effect for anticoagulation will discuss with pharmacy is hemodynamically stable no dyspnea sharp pain cough sputum or hemoptysis Jacob Bauer [id number] DOB: 11-26-1935 DOA: 12/17/2015 PCP: Jacob Hampshire, MD             Physical Exam: Blood pressure 158/71, pulse 86, temperature 98 F (36.7 C), temperature source Oral, resp. rate 20, height 5\' 7"  (1.702 m), weight 184 lb (83.462 kg), SpO2 98 %. lungs clear to A&P no rales wheeze rhonchi heart regular rhythm no S3-S4 no heaves thrills rubs abdomen soft nontender bowel sounds normoactive   Investigations:  Recent Results (from the past 240 hour(s))  MRSA PCR Screening     Status: None   Collection Time: 12/19/15 12:15 PM  Result Value Ref Range Status   MRSA by PCR NEGATIVE NEGATIVE Final    Comment:        The GeneXpert MRSA Assay (FDA approved for NASAL specimens only), is one component of a comprehensive MRSA colonization surveillance program. It is not intended to diagnose MRSA infection nor to guide or monitor treatment for MRSA infections.      Basic Metabolic Panel: No results for input(s): NA, K, CL, CO2, GLUCOSE, BUN, CREATININE, CALCIUM, MG, PHOS in the last 72 hours. Liver Function Tests: No results for input(s): AST, ALT, ALKPHOS, BILITOT, PROT, ALBUMIN in the last 72 hours.   CBC:  Recent Labs  12/21/15 0637 12/22/15 0445  WBC 8.9 8.8  HGB 12.7* 12.6*  HCT 36.3* 36.2*  MCV 87.1 87.2  PLT 157 169    No results found.    Medications:   Impression: Oral anticoagulation  Principal Problem:   Syncope and collapse Active Problems:   Chest pain   HLD (hyperlipidemia)   GERD (gastroesophageal reflux disease)   Acute pulmonary embolism (HCC)   Dyspnea   Renal insufficiency 3- unclear duration   Shortness of breath     Plan: Eliquis 10 mg by mouth twice a  day continue heparin check CBC with platelet count in a.m.  Consultants: Cardiology and pulmonology   Procedures   Antibiotics:                   Code Status:   Family Communication:    Disposition Plan see plan above  Time spent: 30 minutes   LOS: 4 days   Jacob Bauer M   12/22/2015, 11:15 AM

## 2015-12-23 LAB — CBC WITH DIFFERENTIAL/PLATELET
BASOS ABS: 0.1 10*3/uL (ref 0.0–0.1)
Basophils Relative: 1 %
Eosinophils Absolute: 0.8 10*3/uL — ABNORMAL HIGH (ref 0.0–0.7)
Eosinophils Relative: 10 %
HEMATOCRIT: 37.8 % — AB (ref 39.0–52.0)
Hemoglobin: 13 g/dL (ref 13.0–17.0)
LYMPHS ABS: 2 10*3/uL (ref 0.7–4.0)
LYMPHS PCT: 27 %
MCH: 30.4 pg (ref 26.0–34.0)
MCHC: 34.4 g/dL (ref 30.0–36.0)
MCV: 88.5 fL (ref 78.0–100.0)
Monocytes Absolute: 0.9 10*3/uL (ref 0.1–1.0)
Monocytes Relative: 11 %
NEUTROS ABS: 3.9 10*3/uL (ref 1.7–7.7)
Neutrophils Relative %: 51 %
Platelets: 173 10*3/uL (ref 150–400)
RBC: 4.27 MIL/uL (ref 4.22–5.81)
RDW: 13.5 % (ref 11.5–15.5)
WBC: 7.6 10*3/uL (ref 4.0–10.5)

## 2015-12-23 MED ORDER — APIXABAN 5 MG PO TABS: 10.0000 mg | ORAL_TABLET | Freq: Two times a day (BID) | ORAL | Status: DC

## 2015-12-23 MED ORDER — ASPIRIN 325 MG PO TBEC: 325.0000 mg | DELAYED_RELEASE_TABLET | Freq: Every day | ORAL | Status: DC

## 2015-12-23 MED ORDER — PANTOPRAZOLE SODIUM 40 MG PO TBEC: 40.0000 mg | DELAYED_RELEASE_TABLET | Freq: Every day | ORAL | Status: DC

## 2015-12-23 MED ORDER — APIXABAN 5 MG PO TABS: 5.0000 mg | ORAL_TABLET | Freq: Two times a day (BID) | ORAL | Status: DC

## 2015-12-23 NOTE — Care Management Note (Signed)
Case Management Note  Patient Details  Name: RILAN HONECK MRN: [id number] Date of Birth: [birth year]  Subjective/Objective:                    Action/Plan:   Expected Discharge Date:  12/20/15               Expected Discharge Plan:  Home/Self Care  In-House Referral:  NA  Discharge planning Services  CM Consult  Post Acute Care Choice:  NA Choice offered to:  NA  DME Arranged:    DME Agency:     HH Arranged:    [place] Agency:     Status of Service:  Completed, signed off  Medicare Important Message Given:  Yes Date Medicare IM Given:    Medicare IM give by:    Date Additional Medicare IM Given:    Additional Medicare Important Message give by:     If discussed at [place] of Stay Meetings, dates discussed:    Additional Comments: Pt discharged home today on Eliquis. Pt given care for 30 days free of medications. Pts wife stated Dr. Luan Pulling would be taking pt on for PCP. No further CM needs noted. Christinia Gully [place], RN 12/23/2015, 11:18 AM

## 2015-12-23 NOTE — Care Management Important Message (Signed)
Important Message  Patient Details  Name: Jacob Bauer MRN: [id number] Date of Birth: 09-Oct-1935   Medicare Important Message Given:  Yes    Joylene Draft, RN 12/23/2015, 11:17 AM

## 2015-12-23 NOTE — Discharge Summary (Signed)
Physician Discharge Summary  Jacob Bauer [id number] DOB: 09-14-1935 DOA: 12/17/2015  PCP: Lanette Hampshire, MD  Admit date: 12/17/2015 Discharge date: 12/23/2015   Recommendations for Outpatient Follow-up:  Patient is advised to take Eliquis 10 mg by mouth twice a day for 6 days then reduce the dosage to 5 mg by mouth twice a day thereafter and follow-up with Dr. Luan Pulling within one week's time if not then to follow-up in my office is also to resume all other previous medicines from prior to hospitalization Discharge Diagnoses:  Principal Problem:   Syncope and collapse Active Problems:   Chest pain   HLD (hyperlipidemia)   GERD (gastroesophageal reflux disease)   Acute pulmonary embolism (HCC)   Dyspnea   Renal insufficiency 3- unclear duration   Shortness of breath   Discharge Condition: Good  Filed Weights   12/19/15 0500 12/20/15 0500 12/21/15 0107  Weight: 182 lb 12.2 oz (82.9 kg) 184 lb 1.4 oz (83.5 kg) 184 lb (83.462 kg)    History of present illness:  Patient is 80 year old white male patient of Dr. Emilee Hero who was admitted with acute as significant pulmonary embolism with right ventricular strain documented by echocardiogram treated with IV heparin seen in consultation by cardiology as well as pulmonology he was recommended to keep him on IV heparin for 5 days time to keep therapeutic options open such as TPA should be necessary however the patient remained hemodynamically stable throughout hospital without significant cough chest pain dyspnea desaturation or hemoptysis days as patient was switched to Eliquis 10 mg by mouth twice a day for 7 days then cautioned to decrease it to 5 mg by mouth twice a day thereafter patient and his wife signet fully understand this in addition his [place] Doppler revealed left sided nonocclusive thrombosed thrombotic issues in the deep femoral vein as well as popliteal system  Hospital Course:  See history of present  illness  Procedures:    Consultations: Pulmonary and cardiology Discharge Instructions  Discharge Instructions    Discharge instructions    Complete by:  As directed      Discharge patient    Complete by:  As directed             Medication List    STOP taking these medications        acetaminophen 325 MG tablet  Commonly known as:  TYLENOL     ranitidine 150 MG tablet  Commonly known as:  ZANTAC      TAKE these medications        apixaban 5 MG Tabs tablet  Commonly known as:  ELIQUIS  Take 2 tablets (10 mg total) by mouth 2 (two) times daily.     apixaban 5 MG Tabs tablet  Commonly known as:  ELIQUIS  Take 1 tablet (5 mg total) by mouth 2 (two) times daily.  Start taking on:  12/29/2015     aspirin 325 MG EC tablet  Take 1 tablet (325 mg total) by mouth daily.     CENTRUM ADULTS Tabs  Take 1 tablet by mouth daily.     Co Q-10 100 MG Caps  Take 1 tablet by mouth daily.     guaiFENesin-dextromethorphan 100-10 MG/5ML syrup  Commonly known as:  ROBITUSSIN DM  Take 15 mLs by mouth every 4 (four) hours as needed for cough.     pantoprazole 40 MG tablet  Commonly known as:  PROTONIX  Take 1 tablet (40 mg total) by mouth daily.  simvastatin 40 MG tablet  Commonly known as:  ZOCOR  Take 40 mg by mouth daily.     ZYRTEC ALLERGY 10 MG tablet  Generic drug:  cetirizine  Take 10 mg by mouth daily.       Allergies  Allergen Reactions  . Bee Venom   . Penicillins Rash      The results of significant diagnostics from this hospitalization (including imaging, microbiology, ancillary and laboratory) are listed below for reference.    Significant Diagnostic Studies: Ct Head Wo Contrast  12/17/2015  CLINICAL DATA:  Syncope this morning with a blow to the forehead. Initial encounter. EXAM: CT HEAD WITHOUT CONTRAST TECHNIQUE: Contiguous axial images were obtained from the base of the skull through the vertex without intravenous contrast. COMPARISON:  None.  FINDINGS: The brain is atrophic with extensive chronic microvascular ischemic change. No evidence of acute intracranial abnormality including hemorrhage, infarct, mass lesion, mass effect, midline shift or abnormal extra-axial fluid collection is seen. There is no hydrocephalus or pneumocephalus. The calvarium is intact. Imaged paranasal sinuses and mastoid air cells are clear. IMPRESSION: No acute abnormality. Electronically Signed   By: Inge Rise M.D.   On: 12/17/2015 11:18   Ct Angio Chest Pe W/cm &/or Wo Cm  12/17/2015  CLINICAL DATA:  Syncopal episode today with chest pain and shortness of breath EXAM: CT ANGIOGRAPHY CHEST WITH CONTRAST TECHNIQUE: Multidetector CT imaging of the chest was performed using the standard protocol during bolus administration of intravenous contrast. Multiplanar CT image reconstructions and MIPs were obtained to evaluate the vascular anatomy. CONTRAST:  174mL OMNIPAQUE IOHEXOL 350 MG/ML SOLN COMPARISON:  None. FINDINGS: Lungs are well aerated bilaterally. No focal confluent infiltrate is seen. Small left-sided pleural effusion is noted. The thoracic inlet is within normal limits. The thoracic aorta and its branches show mild atherosclerotic calcification without aneurysmal dilatation or dissection. The pulmonary artery is well visualized and demonstrates significant bilateral pulmonary emboli as well as a central saddle embolus. The RV LV ratio measures 1.1 indicating a degree of right heart strain. No hilar or mediastinal adenopathy is seen. The visualized upper abdomen is within normal limits. No acute bony abnormality is noted. Review of the MIP images confirms the above findings. IMPRESSION: Positive for acute PE with CT evidence of right heart strain (RV/LV Ratio = 1.1) consistent with at least submassive (intermediate risk) PE. The presence of right heart strain has been associated with an increased risk of morbidity and mortality. Please activate Code PE by paging  [phone]. Small left pleural effusion Critical Value/emergent results were called by telephone at the time of interpretation on 12/17/2015 at 8:04 pm to Dr. Darrick Meigs, who verbally acknowledged these results. Electronically Signed   By: Inez Catalina M.D.   On: 12/17/2015 20:12   US Venous Img Lower Bilateral  12/18/2015  CLINICAL DATA:  Significant PE with right heart strain EXAM: BILATERAL LOWER EXTREMITY VENOUS DOPPLER ULTRASOUND TECHNIQUE: Gray-scale sonography with graded compression, as well as color Doppler and duplex ultrasound were performed to evaluate the lower extremity deep venous systems from the level of the common femoral vein and including the common femoral, femoral, profunda femoral, popliteal and calf veins including the posterior tibial, peroneal and gastrocnemius veins when visible. The superficial great saphenous vein was also interrogated. Spectral Doppler was utilized to evaluate flow at rest and with distal augmentation maneuvers in the common femoral, femoral and popliteal veins. COMPARISON:  None. FINDINGS: RIGHT LOWER EXTREMITY Common Femoral Vein: No evidence of thrombus. Normal compressibility,  respiratory phasicity and response to augmentation. Saphenofemoral Junction: No evidence of thrombus. Normal compressibility and flow on color Doppler imaging. Profunda Femoral Vein: No evidence of thrombus. Normal compressibility and flow on color Doppler imaging. Femoral Vein: No evidence of thrombus. Normal compressibility, respiratory phasicity and response to augmentation. Popliteal Vein: No evidence of thrombus. Normal compressibility, respiratory phasicity and response to augmentation. Calf Veins: No evidence of thrombus. Normal compressibility and flow on color Doppler imaging. Superficial Great Saphenous Vein: No evidence of thrombus. Normal compressibility and flow on color Doppler imaging. Venous Reflux:  None. Other Findings:  None. LEFT LOWER EXTREMITY Common Femoral Vein:  Nonocclusive thrombus is noted. Decreased compressibility is seen. Saphenofemoral Junction: No evidence of thrombus. Normal compressibility and flow on color Doppler imaging. Profunda Femoral Vein: Nonocclusive thrombus is noted. Decreased compressibility is noted. Femoral Vein: No evidence of thrombus. Normal compressibility, respiratory phasicity and response to augmentation. Popliteal Vein: No evidence of thrombus. Normal compressibility, respiratory phasicity and response to augmentation. Calf Veins: Nonocclusive thrombus is noted in the posterior tibial and peroneal veins. Decreased compressibility is noted. Superficial Great Saphenous Vein: No evidence of thrombus. Normal compressibility and flow on color Doppler imaging. Venous Reflux:  None. Other Findings:  None. IMPRESSION: No evidence of deep venous thrombosis on the right. Multifocal areas of deep venous thrombosis on the left. Electronically Signed   By: Inez Catalina M.D.   On: 12/18/2015 13:04   Dg Chest Port 1 View  12/17/2015  CLINICAL DATA:  Left chest pain EXAM: PORTABLE CHEST 1 VIEW COMPARISON:  05/05/2005 FINDINGS: Heart size is upper normal.  Negative for heart failure. Lungs are clear without infiltrate or effusion. Negative for mass lesion. No interval change. IMPRESSION: No active disease. Electronically Signed   By: Franchot Gallo M.D.   On: 12/17/2015 10:09    Microbiology: Recent Results (from the past 240 hour(s))  MRSA PCR Screening     Status: None   Collection Time: 12/19/15 12:15 PM  Result Value Ref Range Status   MRSA by PCR NEGATIVE NEGATIVE Final    Comment:        The GeneXpert MRSA Assay (FDA approved for NASAL specimens only), is one component of a comprehensive MRSA colonization surveillance program. It is not intended to diagnose MRSA infection nor to guide or monitor treatment for MRSA infections.      Labs: Basic Metabolic Panel:  Recent Labs Lab 12/17/15 0956 12/18/15 0205  NA 138 134*  K  3.9 4.2  CL 105 108  CO2 24 19*  GLUCOSE 171* 134*  BUN 19 21*  CREATININE 1.40* 1.42*  CALCIUM 9.1 8.5*   Liver Function Tests:  Recent Labs Lab 12/17/15 0956  AST 21  ALT 19  ALKPHOS 66  BILITOT 0.6  PROT 7.2  ALBUMIN 3.3*    Recent Labs Lab 12/17/15 0956  LIPASE 58*   No results for input(s): AMMONIA in the last 168 hours. CBC:  Recent Labs Lab 12/17/15 0956  12/19/15 0449 12/20/15 0435 12/21/15 0637 12/22/15 0445 12/23/15 0631  WBC 11.6*  < > 8.5 9.5 8.9 8.8 7.6  NEUTROABS 8.7*  --   --   --   --   --  3.9  HGB 14.2  < > 12.4* 12.4* 12.7* 12.6* 13.0  HCT 41.9  < > 36.2* 36.6* 36.3* 36.2* 37.8*  MCV 90.3  < > 88.9 88.0 87.1 87.2 88.5  PLT 144*  < > 136* 147* 157 169 173  < > = values in  this interval not displayed. Cardiac Enzymes:  Recent Labs Lab 12/17/15 0956 12/17/15 1337 12/17/15 1934 12/18/15 0205  TROPONINI 0.05* 0.29* 0.39* 0.31*   BNP: BNP (last 3 results) No results for input(s): BNP in the last 8760 hours.  ProBNP (last 3 results) No results for input(s): PROBNP in the last 8760 hours.  CBG: No results for input(s): GLUCAP in the last 168 hours.     Signed:  Kseniya Grunden Jerilynn Mages  [facility] Pager: [phone] 12/23/2015, 11:04 AM

## 2015-12-24 LAB — CARDIOLIPIN ANTIBODIES, IGG, IGM, IGA
ANTICARDIOLIPIN IGM: 12 [MPL'U]/mL (ref 0–12)
Anticardiolipin IgG: <9 GPL U/mL (ref 0–14)

## 2015-12-24 LAB — PROTEIN C, TOTAL: Protein C, Total: 72 % (ref 60–150)

## 2015-12-25 DIAGNOSIS — Z7901 Long term (current) use of anticoagulants: Secondary | ICD-10-CM | POA: Diagnosis not present

## 2015-12-25 LAB — PROTHROMBIN GENE MUTATION

## 2015-12-25 LAB — FACTOR 5 LEIDEN

## 2016-01-02 DIAGNOSIS — E785 Hyperlipidemia, unspecified: Secondary | ICD-10-CM | POA: Diagnosis not present

## 2016-01-02 DIAGNOSIS — K219 Gastro-esophageal reflux disease without esophagitis: Secondary | ICD-10-CM | POA: Diagnosis not present

## 2016-01-02 DIAGNOSIS — I2609 Other pulmonary embolism with acute cor pulmonale: Secondary | ICD-10-CM | POA: Diagnosis not present

## 2016-01-02 DIAGNOSIS — R55 Syncope and collapse: Secondary | ICD-10-CM | POA: Diagnosis not present

## 2016-01-07 ENCOUNTER — Ambulatory Visit (HOSPITAL_COMMUNITY)
Admission: RE | Admit: 2016-01-07 | Discharge: 2016-01-07 | Disposition: A | Discharge status: Home / Self Care | Payer: Medicare Other | Source: Ambulatory Visit | Attending: Pulmonary Disease | Admitting: Pulmonary Disease

## 2016-01-07 DIAGNOSIS — E785 Hyperlipidemia, unspecified: Secondary | ICD-10-CM | POA: Insufficient documentation

## 2016-01-07 DIAGNOSIS — I351 Nonrheumatic aortic (valve) insufficiency: Secondary | ICD-10-CM | POA: Insufficient documentation

## 2016-01-07 DIAGNOSIS — K219 Gastro-esophageal reflux disease without esophagitis: Secondary | ICD-10-CM | POA: Insufficient documentation

## 2016-01-07 DIAGNOSIS — R55 Syncope and collapse: Secondary | ICD-10-CM | POA: Diagnosis not present

## 2016-02-03 DIAGNOSIS — I2602 Saddle embolus of pulmonary artery with acute cor pulmonale: Secondary | ICD-10-CM | POA: Diagnosis not present

## 2016-02-03 DIAGNOSIS — K219 Gastro-esophageal reflux disease without esophagitis: Secondary | ICD-10-CM | POA: Diagnosis not present

## 2016-02-03 DIAGNOSIS — R6510 Systemic inflammatory response syndrome (SIRS) of non-infectious origin without acute organ dysfunction: Secondary | ICD-10-CM | POA: Diagnosis not present

## 2016-02-03 DIAGNOSIS — E785 Hyperlipidemia, unspecified: Secondary | ICD-10-CM | POA: Diagnosis not present

## 2016-04-22 DIAGNOSIS — E785 Hyperlipidemia, unspecified: Secondary | ICD-10-CM | POA: Diagnosis not present

## 2016-04-22 DIAGNOSIS — K219 Gastro-esophageal reflux disease without esophagitis: Secondary | ICD-10-CM | POA: Diagnosis not present

## 2016-04-22 DIAGNOSIS — I2602 Saddle embolus of pulmonary artery with acute cor pulmonale: Secondary | ICD-10-CM | POA: Diagnosis not present

## 2016-04-22 DIAGNOSIS — Z125 Encounter for screening for malignant neoplasm of prostate: Secondary | ICD-10-CM | POA: Diagnosis not present

## 2016-05-04 DIAGNOSIS — E785 Hyperlipidemia, unspecified: Secondary | ICD-10-CM | POA: Diagnosis not present

## 2016-05-04 DIAGNOSIS — K210 Gastro-esophageal reflux disease with esophagitis: Secondary | ICD-10-CM | POA: Diagnosis not present

## 2016-05-04 DIAGNOSIS — N182 Chronic kidney disease, stage 2 (mild): Secondary | ICD-10-CM | POA: Diagnosis not present

## 2016-05-04 DIAGNOSIS — I2602 Saddle embolus of pulmonary artery with acute cor pulmonale: Secondary | ICD-10-CM | POA: Diagnosis not present

## 2016-08-06 DIAGNOSIS — D225 Melanocytic nevi of trunk: Secondary | ICD-10-CM | POA: Diagnosis not present

## 2016-08-06 DIAGNOSIS — L570 Actinic keratosis: Secondary | ICD-10-CM | POA: Diagnosis not present

## 2016-08-06 DIAGNOSIS — X32XXXD Exposure to sunlight, subsequent encounter: Secondary | ICD-10-CM | POA: Diagnosis not present

## 2016-09-18 DIAGNOSIS — Z23 Encounter for immunization: Secondary | ICD-10-CM | POA: Diagnosis not present

## 2016-10-28 DIAGNOSIS — N182 Chronic kidney disease, stage 2 (mild): Secondary | ICD-10-CM | POA: Diagnosis not present

## 2016-10-28 DIAGNOSIS — E785 Hyperlipidemia, unspecified: Secondary | ICD-10-CM | POA: Diagnosis not present

## 2016-10-28 DIAGNOSIS — K219 Gastro-esophageal reflux disease without esophagitis: Secondary | ICD-10-CM | POA: Diagnosis not present

## 2016-11-04 DIAGNOSIS — N182 Chronic kidney disease, stage 2 (mild): Secondary | ICD-10-CM | POA: Diagnosis not present

## 2016-11-04 DIAGNOSIS — K219 Gastro-esophageal reflux disease without esophagitis: Secondary | ICD-10-CM | POA: Diagnosis not present

## 2016-11-04 DIAGNOSIS — E785 Hyperlipidemia, unspecified: Secondary | ICD-10-CM | POA: Diagnosis not present

## 2016-11-04 DIAGNOSIS — Z23 Encounter for immunization: Secondary | ICD-10-CM | POA: Diagnosis not present

## 2016-11-04 DIAGNOSIS — Z86711 Personal history of pulmonary embolism: Secondary | ICD-10-CM | POA: Diagnosis not present

## 2016-12-26 IMAGING — CR DG CHEST 1V PORT
1 series · 1 of 1 positions shown · non-contrast
Comparison: 05/05/2005

CLINICAL DATA: Left chest pain

EXAM:
PORTABLE CHEST 1 VIEW

[ap portable]
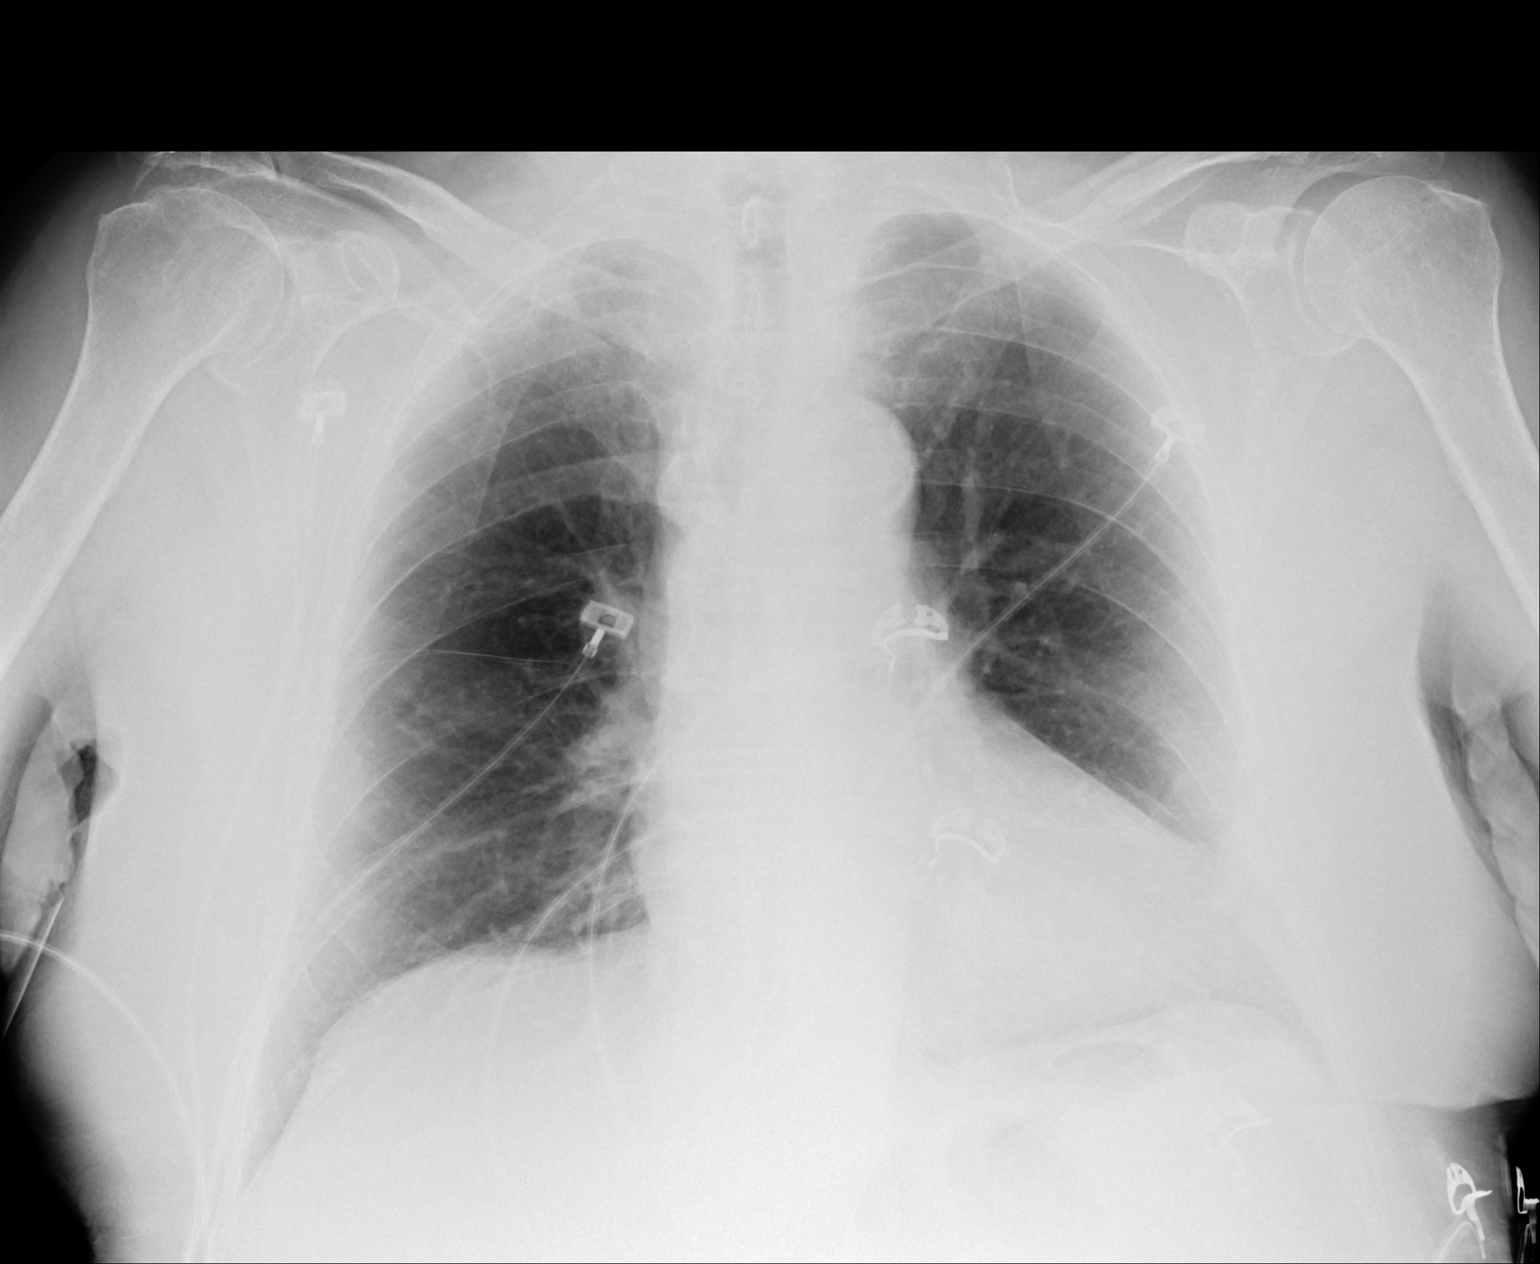

[1 of 1 positions shown; findings below may reference images not displayed]

FINDINGS: Heart size is upper normal.  Negative for heart failure.

Lungs are clear without infiltrate or effusion. Negative for mass
lesion. No interval change.
IMPRESSION: No active disease.

## 2016-12-26 IMAGING — CT CT ANGIO CHEST
2 of 6 series · 6 of 36 positions shown · IV contrast (Omnipaque 300)
Comparison: None.

CLINICAL DATA: Syncopal episode today with chest pain and shortness
of breath

EXAM:
CT ANGIOGRAPHY CHEST WITH CONTRAST
TECHNIQUE: Multidetector CT imaging of the chest was performed using the
standard protocol during bolus administration of intravenous
contrast. Multiplanar CT image reconstructions and MIPs were
obtained to evaluate the vascular anatomy.
CONTRAST:  100mL OMNIPAQUE IOHEXOL 350 MG/ML SOLN

[Series 4: pe 3.0 b40f · axial · 0.64mm/px · z∈[-240,-60]mm · 5 of 90 slices shown]
[im 15/90  lung]
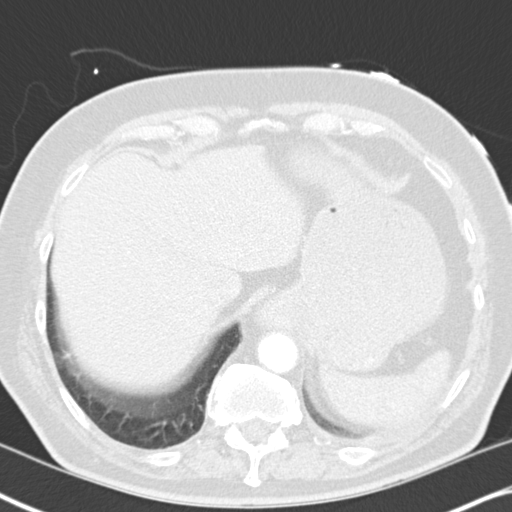
[im 30/90  mediastinal]
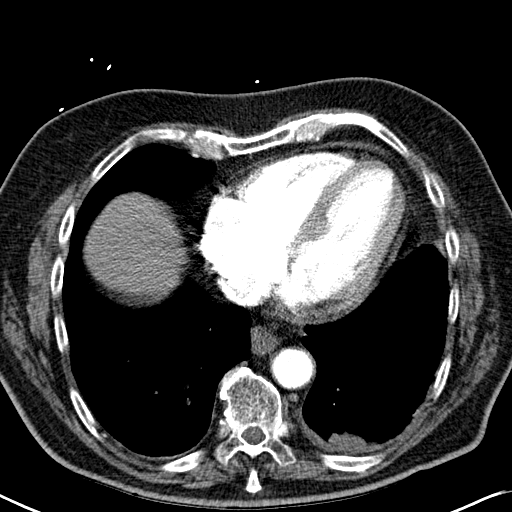
[im 45/90  lung]
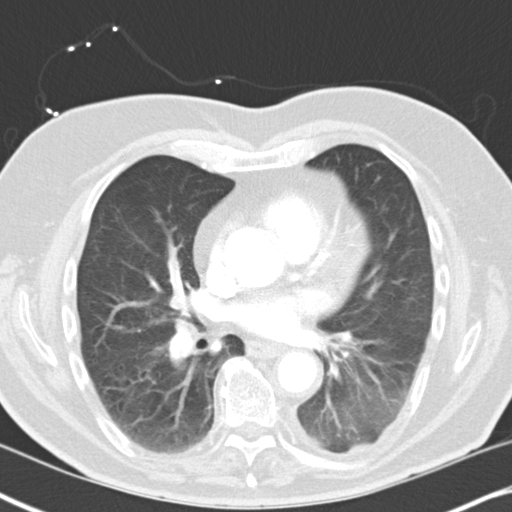
[im 60/90  mediastinal]
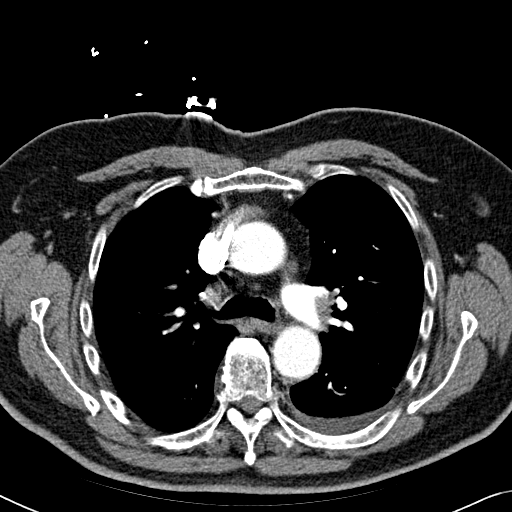
[im 75/90  lung]
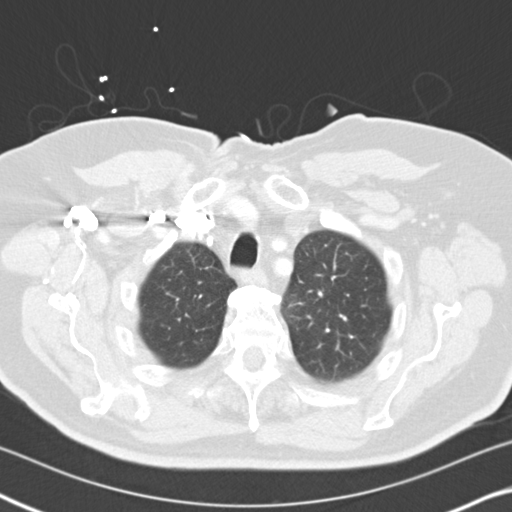

[Series 6: mpr coronal pe 3mm · coronal · 0.57mm/px · 1 of 88 slices shown]
[im 44/88  mediastinal]
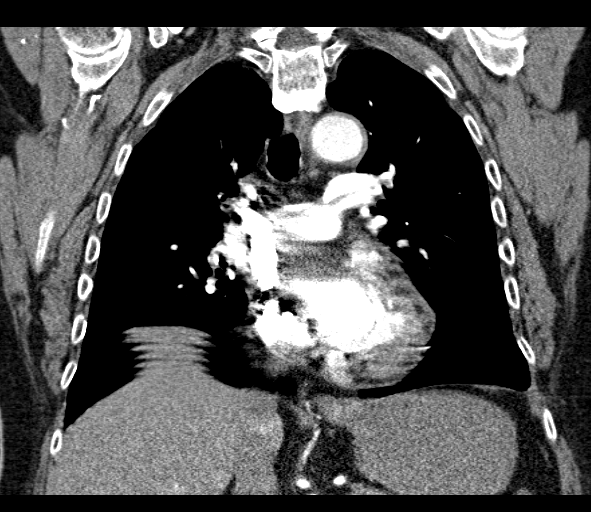

[6 of 36 positions shown; findings below may reference images not displayed]

FINDINGS: Lungs are well aerated bilaterally. No focal confluent infiltrate is
seen. Small left-sided pleural effusion is noted.

The thoracic inlet is within normal limits. The thoracic aorta and
its branches show mild atherosclerotic calcification without
aneurysmal dilatation or dissection. The pulmonary artery is well
visualized and demonstrates significant bilateral pulmonary emboli
as well as a central saddle embolus. The RV LV ratio measures
indicating a degree of right heart strain. No hilar or mediastinal
adenopathy is seen. The visualized upper abdomen is within normal
limits. No acute bony abnormality is noted.

Review of the MIP images confirms the above findings.
IMPRESSION: Positive for acute PE with CT evidence of right heart strain (RV/LV
Ratio = 1.1) consistent with at least submassive (intermediate risk)
PE. The presence of right heart strain has been associated with an
increased risk of morbidity and mortality. Please activate Code PE
by paging [phone].

Small left pleural effusion

Critical Value/emergent results were called by telephone at the time
of interpretation on 12/17/2015 at [DATE] to Dr. Fii, who verbally
acknowledged these results.

## 2016-12-26 IMAGING — CT CT HEAD W/O CM
1 series · 16 of 30 positions shown, 20 images · non-contrast
Comparison: None.

CLINICAL DATA: Syncope this morning with a blow to the forehead.
Initial encounter.

EXAM:
CT HEAD WITHOUT CONTRAST
TECHNIQUE: Contiguous axial images were obtained from the base of the skull
through the vertex without intravenous contrast.

[Series 3: headtrauma 4.8 h37s · axial · 0.43mm/px · z∈[+86,+241]mm · 16 of 36 slices shown, 20 images]
[im 2/36  brain]
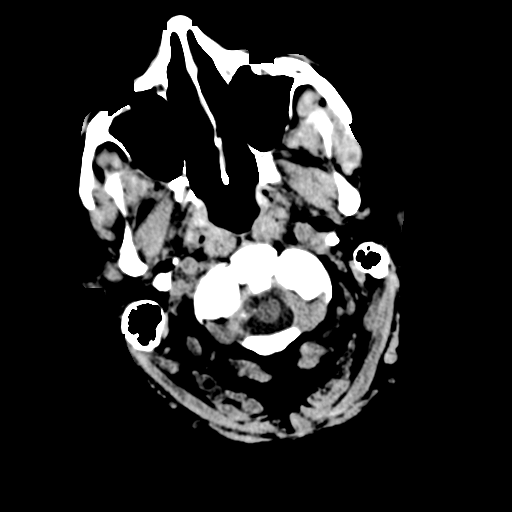
[im 2/36  bone]
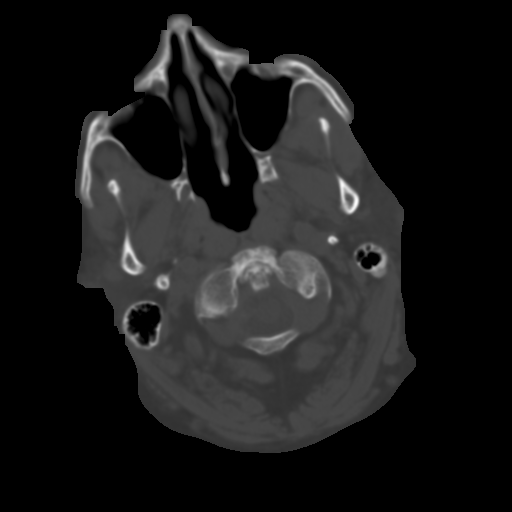
[im 4/36  brain]
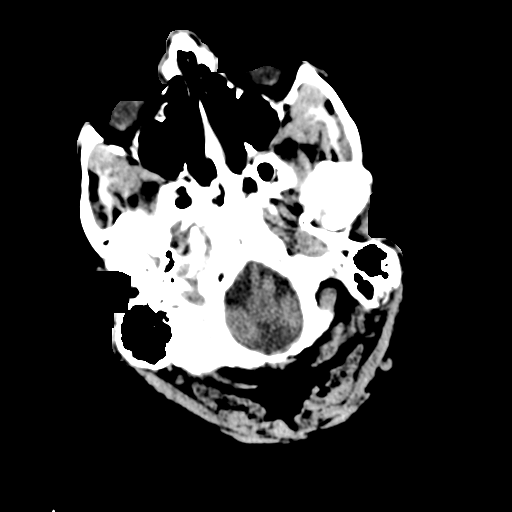
[im 7/36  brain]
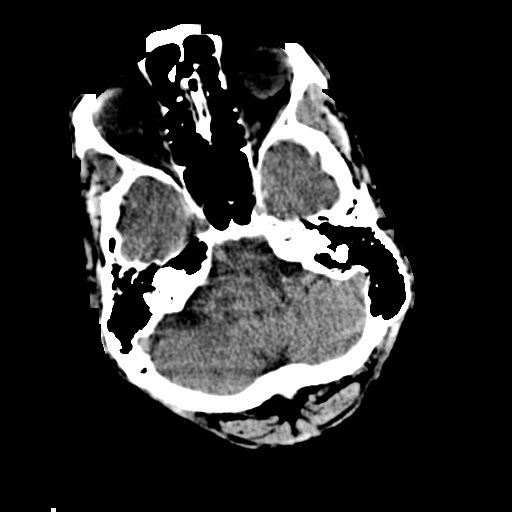
[im 9/36  brain]
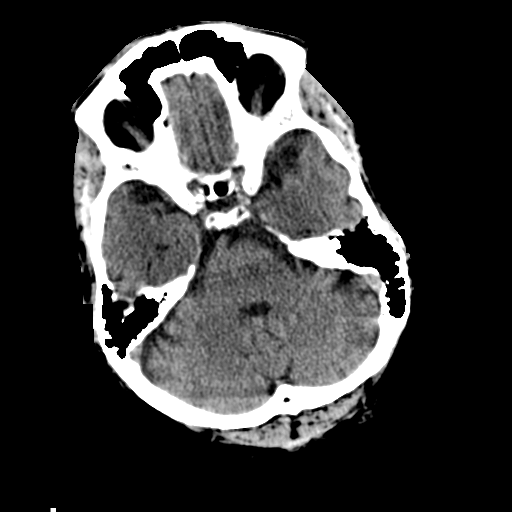
[im 10/36  brain]
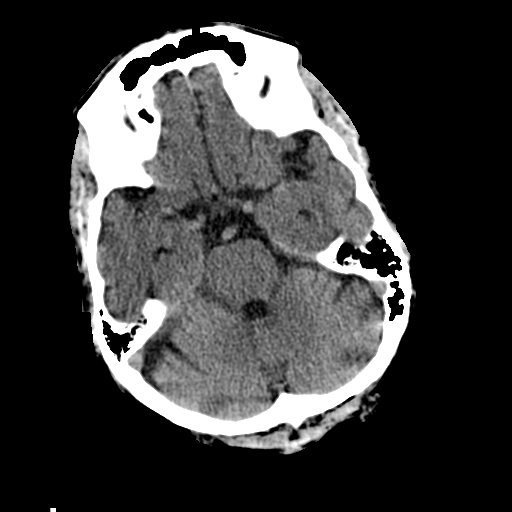
[im 10/36  bone]
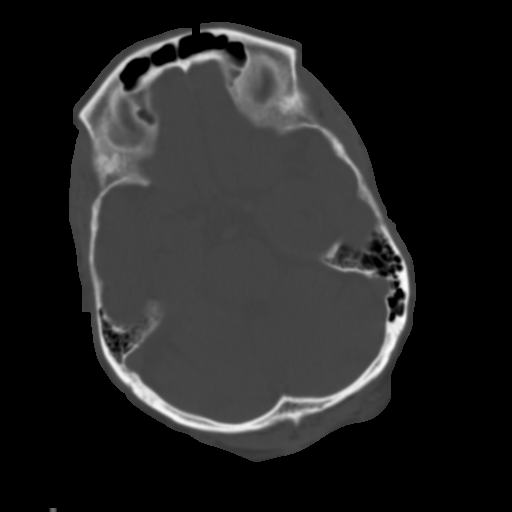
[im 13/36  brain]
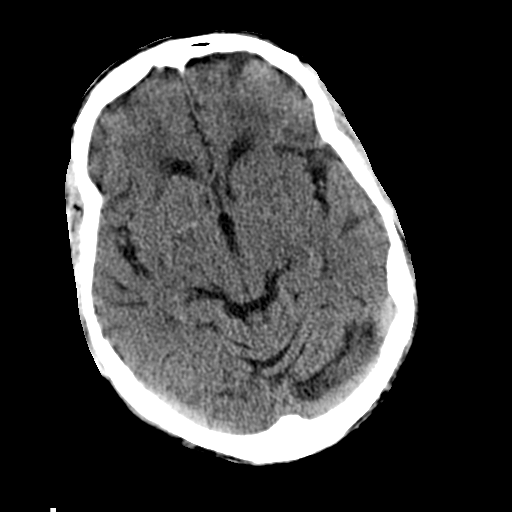
[im 15/36  brain]
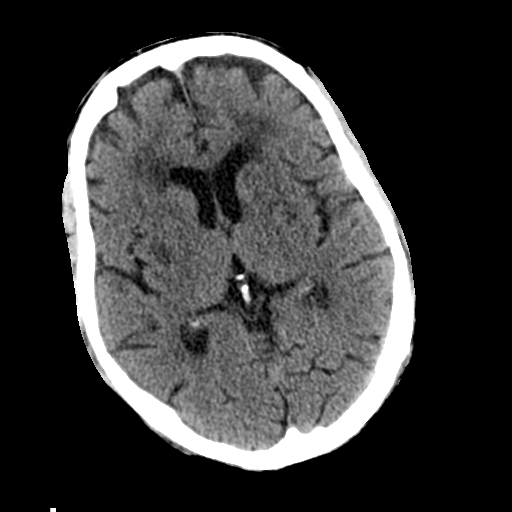
[im 17/36  brain]
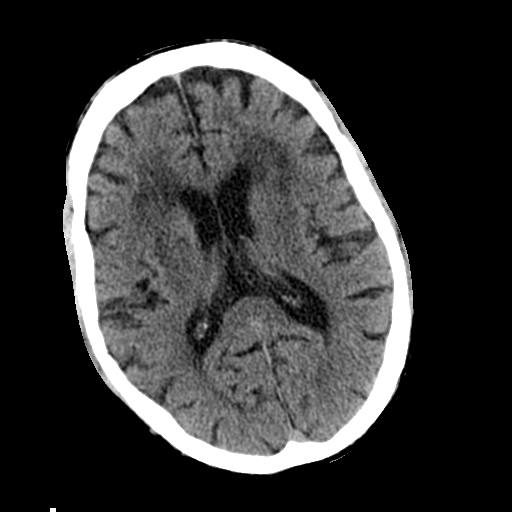
[im 19/36  brain]
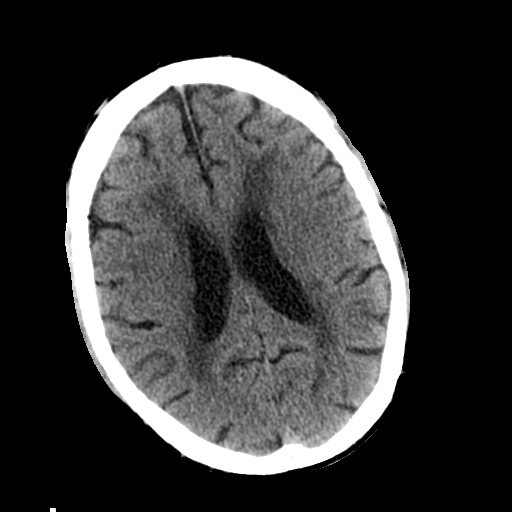
[im 19/36  bone]
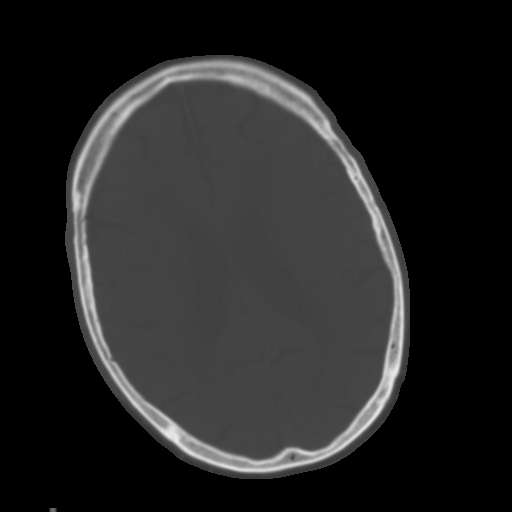
[im 21/36  brain]
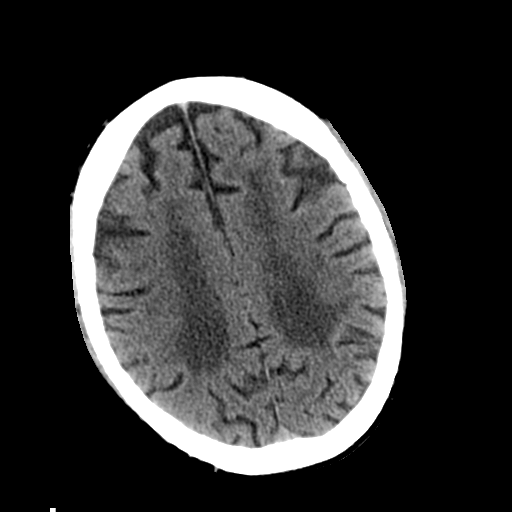
[im 23/36  brain]
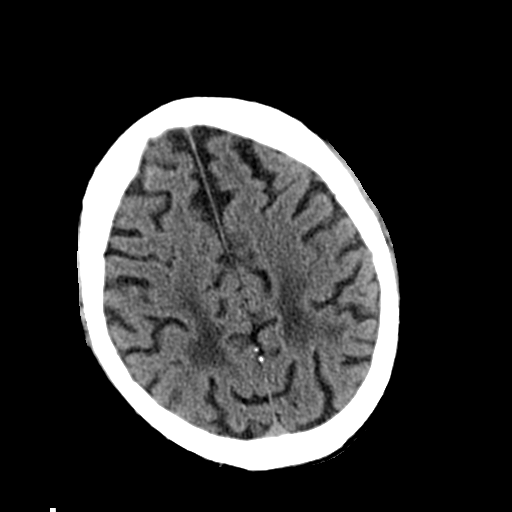
[im 26/36  brain]
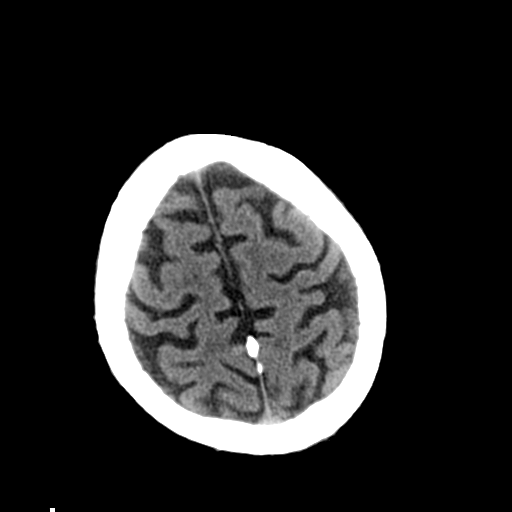
[im 27/36  brain]
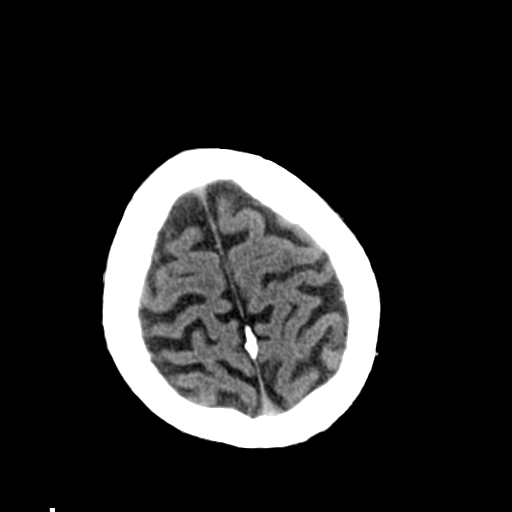
[im 27/36  bone]
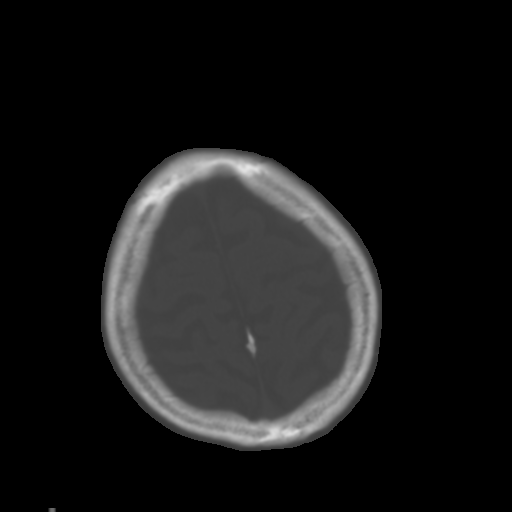
[im 29/36  brain]
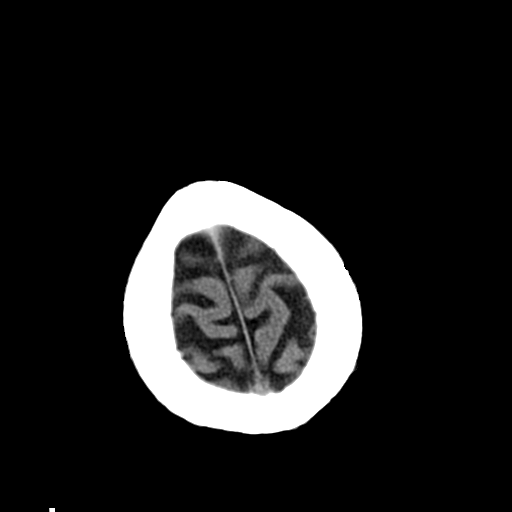
[im 32/36  brain]
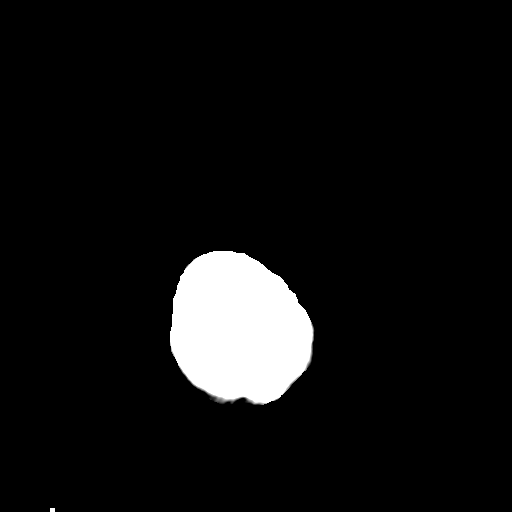
[im 34/36  brain]
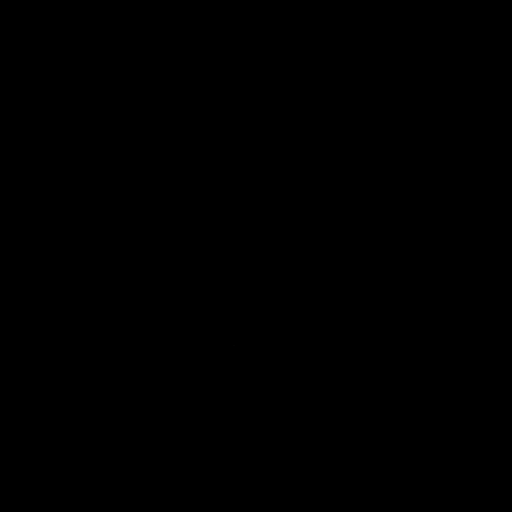

[16 of 30 positions shown; findings below may reference images not displayed]

FINDINGS: The brain is atrophic with extensive chronic microvascular ischemic
change. No evidence of acute intracranial abnormality including
hemorrhage, infarct, mass lesion, mass effect, midline shift or
abnormal extra-axial fluid collection is seen. There is no
hydrocephalus or pneumocephalus. The calvarium is intact. Imaged
paranasal sinuses and mastoid air cells are clear.
IMPRESSION: No acute abnormality.

## 2017-03-25 DIAGNOSIS — J209 Acute bronchitis, unspecified: Secondary | ICD-10-CM | POA: Diagnosis not present

## 2017-03-25 DIAGNOSIS — J309 Allergic rhinitis, unspecified: Secondary | ICD-10-CM | POA: Diagnosis not present

## 2017-03-25 DIAGNOSIS — Z86711 Personal history of pulmonary embolism: Secondary | ICD-10-CM | POA: Diagnosis not present

## 2017-03-25 DIAGNOSIS — K219 Gastro-esophageal reflux disease without esophagitis: Secondary | ICD-10-CM | POA: Diagnosis not present

## 2017-04-15 DIAGNOSIS — L821 Other seborrheic keratosis: Secondary | ICD-10-CM | POA: Diagnosis not present

## 2017-04-15 DIAGNOSIS — D225 Melanocytic nevi of trunk: Secondary | ICD-10-CM | POA: Diagnosis not present

## 2017-04-15 DIAGNOSIS — X32XXXD Exposure to sunlight, subsequent encounter: Secondary | ICD-10-CM | POA: Diagnosis not present

## 2017-04-15 DIAGNOSIS — L570 Actinic keratosis: Secondary | ICD-10-CM | POA: Diagnosis not present

## 2017-04-26 DIAGNOSIS — K219 Gastro-esophageal reflux disease without esophagitis: Secondary | ICD-10-CM | POA: Diagnosis not present

## 2017-04-26 DIAGNOSIS — E785 Hyperlipidemia, unspecified: Secondary | ICD-10-CM | POA: Diagnosis not present

## 2017-04-26 DIAGNOSIS — N182 Chronic kidney disease, stage 2 (mild): Secondary | ICD-10-CM | POA: Diagnosis not present

## 2017-05-04 DIAGNOSIS — K219 Gastro-esophageal reflux disease without esophagitis: Secondary | ICD-10-CM | POA: Diagnosis not present

## 2017-05-04 DIAGNOSIS — J309 Allergic rhinitis, unspecified: Secondary | ICD-10-CM | POA: Diagnosis not present

## 2017-05-04 DIAGNOSIS — E785 Hyperlipidemia, unspecified: Secondary | ICD-10-CM | POA: Diagnosis not present

## 2017-05-04 DIAGNOSIS — Z86711 Personal history of pulmonary embolism: Secondary | ICD-10-CM | POA: Diagnosis not present

## 2017-10-20 DIAGNOSIS — Z23 Encounter for immunization: Secondary | ICD-10-CM | POA: Diagnosis not present

## 2017-10-28 DIAGNOSIS — Z125 Encounter for screening for malignant neoplasm of prostate: Secondary | ICD-10-CM | POA: Diagnosis not present

## 2017-10-28 DIAGNOSIS — N182 Chronic kidney disease, stage 2 (mild): Secondary | ICD-10-CM | POA: Diagnosis not present

## 2017-10-28 DIAGNOSIS — Z86711 Personal history of pulmonary embolism: Secondary | ICD-10-CM | POA: Diagnosis not present

## 2017-10-28 DIAGNOSIS — K219 Gastro-esophageal reflux disease without esophagitis: Secondary | ICD-10-CM | POA: Diagnosis not present

## 2017-10-28 DIAGNOSIS — E785 Hyperlipidemia, unspecified: Secondary | ICD-10-CM | POA: Diagnosis not present

## 2017-11-02 DIAGNOSIS — Z86711 Personal history of pulmonary embolism: Secondary | ICD-10-CM | POA: Diagnosis not present

## 2017-11-02 DIAGNOSIS — E785 Hyperlipidemia, unspecified: Secondary | ICD-10-CM | POA: Diagnosis not present

## 2017-11-02 DIAGNOSIS — K219 Gastro-esophageal reflux disease without esophagitis: Secondary | ICD-10-CM | POA: Diagnosis not present

## 2017-11-02 DIAGNOSIS — J309 Allergic rhinitis, unspecified: Secondary | ICD-10-CM | POA: Diagnosis not present

## 2018-04-28 DIAGNOSIS — N182 Chronic kidney disease, stage 2 (mild): Secondary | ICD-10-CM | POA: Diagnosis not present

## 2018-04-28 DIAGNOSIS — Z86711 Personal history of pulmonary embolism: Secondary | ICD-10-CM | POA: Diagnosis not present

## 2018-04-28 DIAGNOSIS — E785 Hyperlipidemia, unspecified: Secondary | ICD-10-CM | POA: Diagnosis not present

## 2018-04-28 DIAGNOSIS — K219 Gastro-esophageal reflux disease without esophagitis: Secondary | ICD-10-CM | POA: Diagnosis not present

## 2018-05-04 DIAGNOSIS — N182 Chronic kidney disease, stage 2 (mild): Secondary | ICD-10-CM | POA: Diagnosis not present

## 2018-05-04 DIAGNOSIS — E039 Hypothyroidism, unspecified: Secondary | ICD-10-CM | POA: Diagnosis not present

## 2018-05-04 DIAGNOSIS — Z86711 Personal history of pulmonary embolism: Secondary | ICD-10-CM | POA: Diagnosis not present

## 2018-05-04 DIAGNOSIS — J301 Allergic rhinitis due to pollen: Secondary | ICD-10-CM | POA: Diagnosis not present

## 2018-07-27 ENCOUNTER — Other Ambulatory Visit: Payer: Self-pay

## 2018-09-19 DIAGNOSIS — Z23 Encounter for immunization: Secondary | ICD-10-CM | POA: Diagnosis not present

## 2018-12-07 DIAGNOSIS — E785 Hyperlipidemia, unspecified: Secondary | ICD-10-CM | POA: Diagnosis not present

## 2018-12-07 DIAGNOSIS — Z86711 Personal history of pulmonary embolism: Secondary | ICD-10-CM | POA: Diagnosis not present

## 2018-12-07 DIAGNOSIS — K219 Gastro-esophageal reflux disease without esophagitis: Secondary | ICD-10-CM | POA: Diagnosis not present

## 2018-12-07 DIAGNOSIS — N182 Chronic kidney disease, stage 2 (mild): Secondary | ICD-10-CM | POA: Diagnosis not present

## 2018-12-27 DIAGNOSIS — Z0000 Encounter for general adult medical examination without abnormal findings: Secondary | ICD-10-CM | POA: Diagnosis not present

## 2019-06-26 DIAGNOSIS — N182 Chronic kidney disease, stage 2 (mild): Secondary | ICD-10-CM | POA: Diagnosis not present

## 2019-06-26 DIAGNOSIS — E785 Hyperlipidemia, unspecified: Secondary | ICD-10-CM | POA: Diagnosis not present

## 2019-06-26 DIAGNOSIS — Z86711 Personal history of pulmonary embolism: Secondary | ICD-10-CM | POA: Diagnosis not present

## 2019-06-26 DIAGNOSIS — K219 Gastro-esophageal reflux disease without esophagitis: Secondary | ICD-10-CM | POA: Diagnosis not present

## 2019-06-26 LAB — CBC AND DIFFERENTIAL
HCT: 47 (ref 41–53)
Hemoglobin: 15.7 (ref 13.5–17.5)
Platelets: 200 (ref 150–399)
WBC: 7.6

## 2019-06-26 LAB — LIPID PANEL
Cholesterol: 139 (ref 0–200)
HDL: 50 (ref 35–70)
LDL Cholesterol: 65
Triglycerides: 159 (ref 40–160)

## 2019-06-26 LAB — TSH: TSH: 3.13 (ref <=5.90)

## 2019-06-28 DIAGNOSIS — J301 Allergic rhinitis due to pollen: Secondary | ICD-10-CM | POA: Diagnosis not present

## 2019-06-28 DIAGNOSIS — E785 Hyperlipidemia, unspecified: Secondary | ICD-10-CM | POA: Diagnosis not present

## 2019-06-28 DIAGNOSIS — Z86711 Personal history of pulmonary embolism: Secondary | ICD-10-CM | POA: Diagnosis not present

## 2019-06-28 DIAGNOSIS — N182 Chronic kidney disease, stage 2 (mild): Secondary | ICD-10-CM | POA: Diagnosis not present

## 2019-09-05 DIAGNOSIS — Z23 Encounter for immunization: Secondary | ICD-10-CM | POA: Diagnosis not present

## 2019-09-27 ENCOUNTER — Ambulatory Visit (INDEPENDENT_AMBULATORY_CARE_PROVIDER_SITE_OTHER): Payer: Medicare Other | Admitting: Family Medicine

## 2019-09-27 ENCOUNTER — Encounter: Payer: Self-pay | Admitting: Family Medicine

## 2019-09-27 ENCOUNTER — Other Ambulatory Visit: Payer: Self-pay

## 2019-09-27 ENCOUNTER — Telehealth: Payer: Self-pay | Admitting: Pulmonary Disease

## 2019-09-27 VITALS — BP 163/78 | HR 91 | Temp 97.7°F | Ht 67.0 in | Wt 176.8 lb

## 2019-09-27 DIAGNOSIS — E785 Hyperlipidemia, unspecified: Secondary | ICD-10-CM

## 2019-09-27 DIAGNOSIS — R944 Abnormal results of kidney function studies: Secondary | ICD-10-CM

## 2019-09-27 DIAGNOSIS — K219 Gastro-esophageal reflux disease without esophagitis: Secondary | ICD-10-CM

## 2019-09-27 DIAGNOSIS — E039 Hypothyroidism, unspecified: Secondary | ICD-10-CM | POA: Insufficient documentation

## 2019-09-27 NOTE — Telephone Encounter (Signed)
Patient is calling and was seen in the office today. Last time he had lab work done was 06/26/2019. He would like to know if he can wait until December to get more labwork. Please contact pt .

## 2019-09-27 NOTE — Progress Notes (Addendum)
New Patient Office Visit  Subjective:  Patient ID: Jacob Bauer, male    DOB: October 01, 1935  Age: 84 y.o. MRN: [id number]  CC:  Chief Complaint  Patient presents with  . Establish Care  abnormal renal function/hypothyroid/GERD/hyperlpidemia HPI Jacob Bauer presents for  06-27-19 labwork-reviewed-glucose 94, renal function GFR 55, Cr 1.23 lipid panel wnl, cbc wnl Past Medical History:  Diagnosis Date  . High cholesterol   . Reflux     Social History   Socioeconomic History  . Marital status: Married    Spouse name: Not on file  . Number of children: Not on file  . Years of education: Not on file  . Highest education level: Not on file  Occupational History  . Not on file  Social Needs  . Financial resource strain: Not on file  . Food insecurity    Worry: Not on file    Inability: Not on file  . Transportation needs    Medical: Not on file    Non-medical: Not on file  Tobacco Use  . Smoking status: Former Smoker    Years: 10.00    Types: Cigarettes  Substance and Sexual Activity  . Alcohol use: No  . Drug use: No  . Sexual activity: Not on file  Lifestyle  . Physical activity    Days per week: Not on file    Minutes per session: Not on file  . Stress: Not on file  Relationships  . Social Herbalist on phone: Not on file    Gets together: Not on file    Attends religious service: Not on file    Active member of club or organization: Not on file    Attends meetings of clubs or organizations: Not on file    Relationship status: Not on file  . Intimate partner violence    Fear of current or ex partner: Not on file    Emotionally abused: Not on file    Physically abused: Not on file    Forced sexual activity: Not on file  Other Topics Concern  . Not on file  Social History Narrative  . Not on file    ROS Review of Systems  Constitutional: Negative.   HENT: Negative.  Negative for hearing loss.   Eyes: Negative.   Respiratory: Negative.         H/o of PE -h/o DVT  Endocrine:       Hypothyroid  Genitourinary: Negative.   Neurological: Negative.     Objective:   Today's Vitals: BP (!) 163/78 (BP Location: Left Arm, Patient Position: Sitting, Cuff Size: Normal)   Pulse 91   Temp 97.7 F (36.5 C) (Oral)   Ht 5\' 7"  (1.702 m)   Wt 176 lb 12.8 oz (80.2 kg)   SpO2 97%   BMI 27.69 kg/m   Physical Exam Constitutional:      Appearance: Normal appearance.  HENT:     Head: Normocephalic and atraumatic.     Right Ear: Tympanic membrane normal.     Left Ear: Tympanic membrane normal.  Neck:     Musculoskeletal: Normal range of motion.  Cardiovascular:     Rate and Rhythm: Normal rate and regular rhythm.     Pulses: Normal pulses.     Heart sounds: Normal heart sounds.  Pulmonary:     Effort: Pulmonary effort is normal.     Breath sounds: Normal breath sounds.  Musculoskeletal: Normal range of motion.  Neurological:  Mental Status: He is alert.  Psychiatric:        Mood and Affect: Mood normal.     Assessment & Plan:  1. Hypothyroidism, unspecified type synthroid - COMPLETE METABOLIC PANEL WITH GFR - TSH - Lipid panel  2. Renal function test abnormal - COMPLETE METABOLIC PANEL WITH GFR - TSH - Lipid panel  3. Hyperlipidemia, unspecified hyperlipidemia type zocor - COMPLETE METABOLIC PANEL WITH GFR - TSH - Lipid panel  4. Gastroesophageal reflux disease without esophagitis No meds currently  Take BP at home -slightly elevated today  Outpatient Encounter Medications as of 09/27/2019  Medication Sig  . apixaban (ELIQUIS) 2.5 MG TABS tablet Take 2.5 mg by mouth 2 (two) times daily.  Marland Kitchen levothyroxine (SYNTHROID) 25 MCG tablet Take 25 mcg by mouth daily before breakfast.  . cetirizine (ZYRTEC ALLERGY) 10 MG tablet Take 10 mg by mouth daily.  . Coenzyme Q10 (CO Q-10) 100 MG CAPS Take 1 tablet by mouth daily.  . Multiple Vitamins-Minerals (CENTRUM ADULTS) TABS Take 1 tablet by mouth daily.  .  simvastatin (ZOCOR) 40 MG tablet Take 40 mg by mouth daily.  . [DISCONTINUED] apixaban (ELIQUIS) 5 MG TABS tablet Take 2 tablets (10 mg total) by mouth 2 (two) times daily. (Patient not taking: Reported on 09/27/2019)  . [DISCONTINUED] apixaban (ELIQUIS) 5 MG TABS tablet Take 1 tablet (5 mg total) by mouth 2 (two) times daily. (Patient not taking: Reported on 09/27/2019)  . [DISCONTINUED] aspirin EC 325 MG EC tablet Take 1 tablet (325 mg total) by mouth daily. (Patient not taking: Reported on 09/27/2019)  . [DISCONTINUED] guaiFENesin-dextromethorphan (ROBITUSSIN DM) 100-10 MG/5ML syrup Take 15 mLs by mouth every 4 (four) hours as needed for cough.  . [DISCONTINUED] pantoprazole (PROTONIX) 40 MG tablet Take 1 tablet (40 mg total) by mouth daily. (Patient not taking: Reported on 09/27/2019)   No facility-administered encounter medications on file as of 09/27/2019.     Follow-up:   1 month-recheck blood pressure, labwork [name], MD

## 2019-09-27 NOTE — Telephone Encounter (Signed)
Routing to Dr. Corum for advice ? 

## 2019-09-27 NOTE — Patient Instructions (Signed)
Check blood pressure first thing in the morning Synthroid-take medication away from other medications

## 2019-09-28 NOTE — Telephone Encounter (Signed)
Received copy of blood work today-ok to wait till Dec

## 2019-09-28 NOTE — Telephone Encounter (Signed)
Patient is aware 

## 2019-10-05 DIAGNOSIS — L821 Other seborrheic keratosis: Secondary | ICD-10-CM | POA: Diagnosis not present

## 2019-10-05 DIAGNOSIS — X32XXXD Exposure to sunlight, subsequent encounter: Secondary | ICD-10-CM | POA: Diagnosis not present

## 2019-10-05 DIAGNOSIS — L570 Actinic keratosis: Secondary | ICD-10-CM | POA: Diagnosis not present

## 2019-10-05 DIAGNOSIS — D225 Melanocytic nevi of trunk: Secondary | ICD-10-CM | POA: Diagnosis not present

## 2019-10-10 ENCOUNTER — Encounter: Payer: Self-pay | Admitting: Family Medicine

## 2019-12-06 LAB — COMPLETE METABOLIC PANEL WITH GFR
AG Ratio: 1.3 (calc) (ref 1.0–2.5)
ALT: 18 U/L (ref 9–46)
AST: 19 U/L (ref 10–35)
Albumin: 4 g/dL (ref 3.6–5.1)
Alkaline phosphatase (APISO): 53 U/L (ref 35–144)
BUN/Creatinine Ratio: 15 (calc) (ref 6–22)
BUN: 19 mg/dL (ref 7–25)
CO2: 28 mmol/L (ref 20–32)
Calcium: 10 mg/dL (ref 8.6–10.3)
Chloride: 104 mmol/L (ref 98–110)
Creat: 1.24 mg/dL — ABNORMAL HIGH (ref 0.70–1.11)
GFR, Est African American: 62 mL/min/{1.73_m2} (ref >=60)
GFR, Est Non African American: 54 mL/min/{1.73_m2} — ABNORMAL LOW (ref >=60)
Globulin: 3.2 g/dL (calc) (ref 1.9–3.7)
Glucose, Bld: 85 mg/dL (ref 65–99)
Potassium: 5.3 mmol/L (ref 3.5–5.3)
Sodium: 139 mmol/L (ref 135–146)
Total Bilirubin: 0.6 mg/dL (ref 0.2–1.2)
Total Protein: 7.2 g/dL (ref 6.1–8.1)

## 2019-12-06 LAB — LIPID PANEL
Cholesterol: 123 mg/dL (ref <200)
HDL: 54 mg/dL (ref >=40)
LDL Cholesterol (Calc): 47 mg/dL (calc)
Non-HDL Cholesterol (Calc): 69 mg/dL (calc) (ref <130)
Total CHOL/HDL Ratio: 2.3 (calc) (ref <5.0)
Triglycerides: 137 mg/dL (ref <150)

## 2019-12-06 LAB — TSH: TSH: 3.71 mIU/L (ref 0.40–4.50)

## 2020-01-09 ENCOUNTER — Telehealth: Payer: Self-pay

## 2020-01-09 DIAGNOSIS — E785 Hyperlipidemia, unspecified: Secondary | ICD-10-CM

## 2020-01-09 MED ORDER — APIXABAN 2.5 MG PO TABS: 2.5000 mg | ORAL_TABLET | Freq: Two times a day (BID) | ORAL | 0 refills | Status: DC

## 2020-01-09 NOTE — Telephone Encounter (Signed)
LeighAnn Talicia Sui, CMA  

## 2020-02-05 ENCOUNTER — Telehealth: Payer: Self-pay | Admitting: Family Medicine

## 2020-02-05 ENCOUNTER — Telehealth: Payer: Self-pay

## 2020-02-05 DIAGNOSIS — E785 Hyperlipidemia, unspecified: Secondary | ICD-10-CM

## 2020-02-05 MED ORDER — APIXABAN 2.5 MG PO TABS: 2.5000 mg | ORAL_TABLET | Freq: Two times a day (BID) | ORAL | 0 refills | Status: DC

## 2020-02-05 NOTE — Telephone Encounter (Signed)
Patient is requesting a refill on apixaban (ELIQUIS) 2.5 MG TABS tablet   Patient would like a 3 month supply.   [place], [place] - [id number] [place] [address] [place] Phone:  [phone]  Fax:  [phone]

## 2020-02-05 NOTE — Telephone Encounter (Signed)
Jacob Bauer, CMA  

## 2020-02-05 NOTE — Telephone Encounter (Signed)
Done

## 2020-03-01 ENCOUNTER — Other Ambulatory Visit: Payer: Self-pay | Admitting: Family Medicine

## 2020-03-01 DIAGNOSIS — E785 Hyperlipidemia, unspecified: Secondary | ICD-10-CM

## 2020-03-04 ENCOUNTER — Other Ambulatory Visit: Payer: Self-pay | Admitting: Emergency Medicine

## 2020-03-04 ENCOUNTER — Telehealth: Payer: Self-pay | Admitting: Family Medicine

## 2020-03-04 ENCOUNTER — Other Ambulatory Visit: Payer: Self-pay | Admitting: Family Medicine

## 2020-03-04 DIAGNOSIS — E785 Hyperlipidemia, unspecified: Secondary | ICD-10-CM

## 2020-03-04 DIAGNOSIS — E039 Hypothyroidism, unspecified: Secondary | ICD-10-CM

## 2020-03-04 MED ORDER — APIXABAN 2.5 MG PO TABS: 2.5000 mg | ORAL_TABLET | Freq: Two times a day (BID) | ORAL | 0 refills | Status: DC

## 2020-03-04 MED ORDER — LEVOTHYROXINE SODIUM 25 MCG PO TABS: 25.0000 ug | ORAL_TABLET | Freq: Every day | ORAL | 0 refills | Status: AC

## 2020-03-04 NOTE — Telephone Encounter (Signed)
Patient is calling and is needing a 3 month supply on the following medications. 1 medication was a called in for 1 month.  ELIQUIS 2.5 MG TABS tablet    levothyroxine (SYNTHROID) 25 MCG tablet   [place], [place] [address] [place] [address] [place] Phone:  [phone]  Fax:  [phone]

## 2020-03-04 NOTE — Telephone Encounter (Signed)
Filled for one month-recommend appt for additional medications

## 2020-03-04 NOTE — Telephone Encounter (Signed)
Please advise if this is okay to refill, pt was last seen in sept, 2020. Was suppose to f/u in 1 month.no appointment has been made.

## 2020-03-28 ENCOUNTER — Ambulatory Visit: Payer: Medicare Other | Admitting: Family Medicine

## 2020-04-11 ENCOUNTER — Ambulatory Visit: Payer: Medicare Other | Admitting: Family Medicine

## 2020-04-15 DIAGNOSIS — E785 Hyperlipidemia, unspecified: Secondary | ICD-10-CM | POA: Diagnosis not present

## 2020-04-15 DIAGNOSIS — Z0189 Encounter for other specified special examinations: Secondary | ICD-10-CM | POA: Diagnosis not present

## 2020-04-15 DIAGNOSIS — Z86711 Personal history of pulmonary embolism: Secondary | ICD-10-CM | POA: Diagnosis not present

## 2020-04-15 DIAGNOSIS — J302 Other seasonal allergic rhinitis: Secondary | ICD-10-CM | POA: Diagnosis not present

## 2020-04-15 DIAGNOSIS — E039 Hypothyroidism, unspecified: Secondary | ICD-10-CM | POA: Diagnosis not present

## 2020-05-02 DIAGNOSIS — Z86711 Personal history of pulmonary embolism: Secondary | ICD-10-CM | POA: Diagnosis not present

## 2020-05-02 DIAGNOSIS — E039 Hypothyroidism, unspecified: Secondary | ICD-10-CM | POA: Diagnosis not present

## 2020-05-02 DIAGNOSIS — J302 Other seasonal allergic rhinitis: Secondary | ICD-10-CM | POA: Diagnosis not present

## 2020-05-02 DIAGNOSIS — E785 Hyperlipidemia, unspecified: Secondary | ICD-10-CM | POA: Diagnosis not present

## 2020-05-08 DIAGNOSIS — E039 Hypothyroidism, unspecified: Secondary | ICD-10-CM | POA: Diagnosis not present

## 2020-05-08 DIAGNOSIS — E785 Hyperlipidemia, unspecified: Secondary | ICD-10-CM | POA: Diagnosis not present

## 2020-05-08 DIAGNOSIS — J302 Other seasonal allergic rhinitis: Secondary | ICD-10-CM | POA: Diagnosis not present

## 2020-05-08 DIAGNOSIS — E875 Hyperkalemia: Secondary | ICD-10-CM | POA: Diagnosis not present

## 2020-05-08 DIAGNOSIS — N1831 Chronic kidney disease, stage 3a: Secondary | ICD-10-CM | POA: Diagnosis not present

## 2020-05-08 DIAGNOSIS — Z86711 Personal history of pulmonary embolism: Secondary | ICD-10-CM | POA: Diagnosis not present

## 2020-05-13 ENCOUNTER — Other Ambulatory Visit: Payer: Self-pay | Admitting: Family Medicine

## 2020-05-13 DIAGNOSIS — E785 Hyperlipidemia, unspecified: Secondary | ICD-10-CM

## 2020-09-13 DIAGNOSIS — E785 Hyperlipidemia, unspecified: Secondary | ICD-10-CM | POA: Diagnosis not present

## 2020-09-13 DIAGNOSIS — E039 Hypothyroidism, unspecified: Secondary | ICD-10-CM | POA: Diagnosis not present

## 2020-09-19 DIAGNOSIS — E785 Hyperlipidemia, unspecified: Secondary | ICD-10-CM | POA: Diagnosis not present

## 2020-09-19 DIAGNOSIS — Z86711 Personal history of pulmonary embolism: Secondary | ICD-10-CM | POA: Diagnosis not present

## 2020-09-19 DIAGNOSIS — N1831 Chronic kidney disease, stage 3a: Secondary | ICD-10-CM | POA: Diagnosis not present

## 2020-09-19 DIAGNOSIS — R351 Nocturia: Secondary | ICD-10-CM | POA: Diagnosis not present

## 2020-09-19 DIAGNOSIS — E875 Hyperkalemia: Secondary | ICD-10-CM | POA: Diagnosis not present

## 2020-09-19 DIAGNOSIS — E039 Hypothyroidism, unspecified: Secondary | ICD-10-CM | POA: Diagnosis not present

## 2020-09-19 DIAGNOSIS — J302 Other seasonal allergic rhinitis: Secondary | ICD-10-CM | POA: Diagnosis not present

## 2020-10-03 DIAGNOSIS — Z23 Encounter for immunization: Secondary | ICD-10-CM | POA: Diagnosis not present

## 2020-10-30 DIAGNOSIS — Z23 Encounter for immunization: Secondary | ICD-10-CM | POA: Diagnosis not present

## 2021-01-08 DIAGNOSIS — J302 Other seasonal allergic rhinitis: Secondary | ICD-10-CM | POA: Diagnosis not present

## 2021-01-08 DIAGNOSIS — Z86711 Personal history of pulmonary embolism: Secondary | ICD-10-CM | POA: Diagnosis not present

## 2021-01-08 DIAGNOSIS — E785 Hyperlipidemia, unspecified: Secondary | ICD-10-CM | POA: Diagnosis not present

## 2021-01-08 DIAGNOSIS — E875 Hyperkalemia: Secondary | ICD-10-CM | POA: Diagnosis not present

## 2021-01-08 DIAGNOSIS — R351 Nocturia: Secondary | ICD-10-CM | POA: Diagnosis not present

## 2021-01-08 DIAGNOSIS — E039 Hypothyroidism, unspecified: Secondary | ICD-10-CM | POA: Diagnosis not present

## 2021-01-08 DIAGNOSIS — Z712 Person consulting for explanation of examination or test findings: Secondary | ICD-10-CM | POA: Diagnosis not present

## 2021-01-08 DIAGNOSIS — N1831 Chronic kidney disease, stage 3a: Secondary | ICD-10-CM | POA: Diagnosis not present

## 2021-01-15 DIAGNOSIS — E875 Hyperkalemia: Secondary | ICD-10-CM | POA: Diagnosis not present

## 2021-01-15 DIAGNOSIS — J302 Other seasonal allergic rhinitis: Secondary | ICD-10-CM | POA: Diagnosis not present

## 2021-01-15 DIAGNOSIS — Z86711 Personal history of pulmonary embolism: Secondary | ICD-10-CM | POA: Diagnosis not present

## 2021-01-15 DIAGNOSIS — E785 Hyperlipidemia, unspecified: Secondary | ICD-10-CM | POA: Diagnosis not present

## 2021-01-15 DIAGNOSIS — R351 Nocturia: Secondary | ICD-10-CM | POA: Diagnosis not present

## 2021-01-15 DIAGNOSIS — E039 Hypothyroidism, unspecified: Secondary | ICD-10-CM | POA: Diagnosis not present

## 2021-01-15 DIAGNOSIS — N1831 Chronic kidney disease, stage 3a: Secondary | ICD-10-CM | POA: Diagnosis not present

## 2021-03-31 DIAGNOSIS — L851 Acquired keratosis [keratoderma] palmaris et plantaris: Secondary | ICD-10-CM | POA: Diagnosis not present

## 2021-03-31 DIAGNOSIS — M79675 Pain in left toe(s): Secondary | ICD-10-CM | POA: Diagnosis not present

## 2021-03-31 DIAGNOSIS — M2012 Hallux valgus (acquired), left foot: Secondary | ICD-10-CM | POA: Diagnosis not present

## 2021-05-08 DIAGNOSIS — Z86711 Personal history of pulmonary embolism: Secondary | ICD-10-CM | POA: Diagnosis not present

## 2021-05-08 DIAGNOSIS — J302 Other seasonal allergic rhinitis: Secondary | ICD-10-CM | POA: Diagnosis not present

## 2021-05-08 DIAGNOSIS — E039 Hypothyroidism, unspecified: Secondary | ICD-10-CM | POA: Diagnosis not present

## 2021-05-08 DIAGNOSIS — Z712 Person consulting for explanation of examination or test findings: Secondary | ICD-10-CM | POA: Diagnosis not present

## 2021-05-08 DIAGNOSIS — N1831 Chronic kidney disease, stage 3a: Secondary | ICD-10-CM | POA: Diagnosis not present

## 2021-05-08 DIAGNOSIS — R351 Nocturia: Secondary | ICD-10-CM | POA: Diagnosis not present

## 2021-05-08 DIAGNOSIS — E875 Hyperkalemia: Secondary | ICD-10-CM | POA: Diagnosis not present

## 2021-05-08 DIAGNOSIS — E785 Hyperlipidemia, unspecified: Secondary | ICD-10-CM | POA: Diagnosis not present

## 2021-05-13 DIAGNOSIS — N1831 Chronic kidney disease, stage 3a: Secondary | ICD-10-CM | POA: Diagnosis not present

## 2021-05-13 DIAGNOSIS — R7301 Impaired fasting glucose: Secondary | ICD-10-CM | POA: Diagnosis not present

## 2021-05-13 DIAGNOSIS — R972 Elevated prostate specific antigen [PSA]: Secondary | ICD-10-CM | POA: Diagnosis not present

## 2021-05-13 DIAGNOSIS — Z86711 Personal history of pulmonary embolism: Secondary | ICD-10-CM | POA: Diagnosis not present

## 2021-05-13 DIAGNOSIS — J302 Other seasonal allergic rhinitis: Secondary | ICD-10-CM | POA: Diagnosis not present

## 2021-05-13 DIAGNOSIS — R600 Localized edema: Secondary | ICD-10-CM | POA: Diagnosis not present

## 2021-05-13 DIAGNOSIS — N401 Enlarged prostate with lower urinary tract symptoms: Secondary | ICD-10-CM | POA: Diagnosis not present

## 2021-05-13 DIAGNOSIS — E875 Hyperkalemia: Secondary | ICD-10-CM | POA: Diagnosis not present

## 2021-05-13 DIAGNOSIS — E782 Mixed hyperlipidemia: Secondary | ICD-10-CM | POA: Diagnosis not present

## 2021-05-13 DIAGNOSIS — E039 Hypothyroidism, unspecified: Secondary | ICD-10-CM | POA: Diagnosis not present

## 2021-08-14 DIAGNOSIS — X32XXXD Exposure to sunlight, subsequent encounter: Secondary | ICD-10-CM | POA: Diagnosis not present

## 2021-08-14 DIAGNOSIS — L570 Actinic keratosis: Secondary | ICD-10-CM | POA: Diagnosis not present

## 2021-08-14 DIAGNOSIS — Z1283 Encounter for screening for malignant neoplasm of skin: Secondary | ICD-10-CM | POA: Diagnosis not present

## 2021-08-14 DIAGNOSIS — D225 Melanocytic nevi of trunk: Secondary | ICD-10-CM | POA: Diagnosis not present

## 2021-08-14 DIAGNOSIS — S50862A Insect bite (nonvenomous) of left forearm, initial encounter: Secondary | ICD-10-CM | POA: Diagnosis not present

## 2021-08-14 DIAGNOSIS — B078 Other viral warts: Secondary | ICD-10-CM | POA: Diagnosis not present

## 2021-09-24 DIAGNOSIS — Z23 Encounter for immunization: Secondary | ICD-10-CM | POA: Diagnosis not present

## 2021-09-29 DIAGNOSIS — Z23 Encounter for immunization: Secondary | ICD-10-CM | POA: Diagnosis not present

## 2021-11-10 DIAGNOSIS — E039 Hypothyroidism, unspecified: Secondary | ICD-10-CM | POA: Diagnosis not present

## 2021-11-10 DIAGNOSIS — I10 Essential (primary) hypertension: Secondary | ICD-10-CM | POA: Diagnosis not present

## 2021-11-10 DIAGNOSIS — E782 Mixed hyperlipidemia: Secondary | ICD-10-CM | POA: Diagnosis not present

## 2021-11-10 DIAGNOSIS — R7301 Impaired fasting glucose: Secondary | ICD-10-CM | POA: Diagnosis not present

## 2021-11-10 DIAGNOSIS — R972 Elevated prostate specific antigen [PSA]: Secondary | ICD-10-CM | POA: Diagnosis not present

## 2021-11-13 DIAGNOSIS — J302 Other seasonal allergic rhinitis: Secondary | ICD-10-CM | POA: Diagnosis not present

## 2021-11-13 DIAGNOSIS — R7301 Impaired fasting glucose: Secondary | ICD-10-CM | POA: Diagnosis not present

## 2021-11-13 DIAGNOSIS — N1831 Chronic kidney disease, stage 3a: Secondary | ICD-10-CM | POA: Diagnosis not present

## 2021-11-13 DIAGNOSIS — R972 Elevated prostate specific antigen [PSA]: Secondary | ICD-10-CM | POA: Diagnosis not present

## 2021-11-13 DIAGNOSIS — Z86711 Personal history of pulmonary embolism: Secondary | ICD-10-CM | POA: Diagnosis not present

## 2021-11-13 DIAGNOSIS — E039 Hypothyroidism, unspecified: Secondary | ICD-10-CM | POA: Diagnosis not present

## 2021-11-13 DIAGNOSIS — Z0001 Encounter for general adult medical examination with abnormal findings: Secondary | ICD-10-CM | POA: Diagnosis not present

## 2021-11-13 DIAGNOSIS — R600 Localized edema: Secondary | ICD-10-CM | POA: Diagnosis not present

## 2021-11-13 DIAGNOSIS — N401 Enlarged prostate with lower urinary tract symptoms: Secondary | ICD-10-CM | POA: Diagnosis not present

## 2021-11-13 DIAGNOSIS — R030 Elevated blood-pressure reading, without diagnosis of hypertension: Secondary | ICD-10-CM | POA: Diagnosis not present

## 2021-11-13 DIAGNOSIS — E782 Mixed hyperlipidemia: Secondary | ICD-10-CM | POA: Diagnosis not present

## 2021-11-13 DIAGNOSIS — E875 Hyperkalemia: Secondary | ICD-10-CM | POA: Diagnosis not present

## 2021-11-26 DIAGNOSIS — E782 Mixed hyperlipidemia: Secondary | ICD-10-CM | POA: Diagnosis not present

## 2021-11-26 DIAGNOSIS — I10 Essential (primary) hypertension: Secondary | ICD-10-CM | POA: Diagnosis not present

## 2021-12-01 DIAGNOSIS — I10 Essential (primary) hypertension: Secondary | ICD-10-CM | POA: Diagnosis not present

## 2021-12-01 DIAGNOSIS — N1831 Chronic kidney disease, stage 3a: Secondary | ICD-10-CM | POA: Diagnosis not present

## 2022-01-01 DIAGNOSIS — I10 Essential (primary) hypertension: Secondary | ICD-10-CM | POA: Diagnosis not present

## 2022-01-01 DIAGNOSIS — N1831 Chronic kidney disease, stage 3a: Secondary | ICD-10-CM | POA: Diagnosis not present

## 2022-05-08 DIAGNOSIS — E039 Hypothyroidism, unspecified: Secondary | ICD-10-CM | POA: Diagnosis not present

## 2022-05-08 DIAGNOSIS — R7301 Impaired fasting glucose: Secondary | ICD-10-CM | POA: Diagnosis not present

## 2022-05-08 DIAGNOSIS — R972 Elevated prostate specific antigen [PSA]: Secondary | ICD-10-CM | POA: Diagnosis not present

## 2022-05-13 DIAGNOSIS — N401 Enlarged prostate with lower urinary tract symptoms: Secondary | ICD-10-CM | POA: Diagnosis not present

## 2022-05-13 DIAGNOSIS — N1831 Chronic kidney disease, stage 3a: Secondary | ICD-10-CM | POA: Diagnosis not present

## 2022-05-13 DIAGNOSIS — R972 Elevated prostate specific antigen [PSA]: Secondary | ICD-10-CM | POA: Diagnosis not present

## 2022-05-13 DIAGNOSIS — R600 Localized edema: Secondary | ICD-10-CM | POA: Diagnosis not present

## 2022-05-13 DIAGNOSIS — E875 Hyperkalemia: Secondary | ICD-10-CM | POA: Diagnosis not present

## 2022-05-13 DIAGNOSIS — Z86711 Personal history of pulmonary embolism: Secondary | ICD-10-CM | POA: Diagnosis not present

## 2022-05-13 DIAGNOSIS — E782 Mixed hyperlipidemia: Secondary | ICD-10-CM | POA: Diagnosis not present

## 2022-05-13 DIAGNOSIS — E039 Hypothyroidism, unspecified: Secondary | ICD-10-CM | POA: Diagnosis not present

## 2022-05-13 DIAGNOSIS — R7301 Impaired fasting glucose: Secondary | ICD-10-CM | POA: Diagnosis not present

## 2022-05-13 DIAGNOSIS — Z7901 Long term (current) use of anticoagulants: Secondary | ICD-10-CM | POA: Diagnosis not present

## 2022-05-13 DIAGNOSIS — J302 Other seasonal allergic rhinitis: Secondary | ICD-10-CM | POA: Diagnosis not present

## 2022-07-27 DIAGNOSIS — H6123 Impacted cerumen, bilateral: Secondary | ICD-10-CM | POA: Diagnosis not present

## 2022-07-30 DIAGNOSIS — H6123 Impacted cerumen, bilateral: Secondary | ICD-10-CM | POA: Diagnosis not present

## 2022-09-16 DIAGNOSIS — Z23 Encounter for immunization: Secondary | ICD-10-CM | POA: Diagnosis not present

## 2022-10-05 DIAGNOSIS — B078 Other viral warts: Secondary | ICD-10-CM | POA: Diagnosis not present

## 2022-10-05 DIAGNOSIS — L570 Actinic keratosis: Secondary | ICD-10-CM | POA: Diagnosis not present

## 2022-10-05 DIAGNOSIS — X32XXXD Exposure to sunlight, subsequent encounter: Secondary | ICD-10-CM | POA: Diagnosis not present

## 2022-10-05 DIAGNOSIS — D225 Melanocytic nevi of trunk: Secondary | ICD-10-CM | POA: Diagnosis not present

## 2022-11-17 DIAGNOSIS — E782 Mixed hyperlipidemia: Secondary | ICD-10-CM | POA: Diagnosis not present

## 2022-11-17 DIAGNOSIS — R7301 Impaired fasting glucose: Secondary | ICD-10-CM | POA: Diagnosis not present

## 2022-11-24 DIAGNOSIS — E782 Mixed hyperlipidemia: Secondary | ICD-10-CM | POA: Diagnosis not present

## 2022-11-24 DIAGNOSIS — D6869 Other thrombophilia: Secondary | ICD-10-CM | POA: Diagnosis not present

## 2022-11-24 DIAGNOSIS — J302 Other seasonal allergic rhinitis: Secondary | ICD-10-CM | POA: Diagnosis not present

## 2022-11-24 DIAGNOSIS — R600 Localized edema: Secondary | ICD-10-CM | POA: Diagnosis not present

## 2022-11-24 DIAGNOSIS — R7301 Impaired fasting glucose: Secondary | ICD-10-CM | POA: Diagnosis not present

## 2022-11-24 DIAGNOSIS — E039 Hypothyroidism, unspecified: Secondary | ICD-10-CM | POA: Diagnosis not present

## 2022-11-24 DIAGNOSIS — I10 Essential (primary) hypertension: Secondary | ICD-10-CM | POA: Diagnosis not present

## 2022-11-24 DIAGNOSIS — Z86711 Personal history of pulmonary embolism: Secondary | ICD-10-CM | POA: Diagnosis not present

## 2022-11-24 DIAGNOSIS — E875 Hyperkalemia: Secondary | ICD-10-CM | POA: Diagnosis not present

## 2022-11-24 DIAGNOSIS — N1831 Chronic kidney disease, stage 3a: Secondary | ICD-10-CM | POA: Diagnosis not present

## 2022-11-24 DIAGNOSIS — N401 Enlarged prostate with lower urinary tract symptoms: Secondary | ICD-10-CM | POA: Diagnosis not present

## 2022-11-24 DIAGNOSIS — R972 Elevated prostate specific antigen [PSA]: Secondary | ICD-10-CM | POA: Diagnosis not present

## 2023-05-12 ENCOUNTER — Emergency Department (HOSPITAL_COMMUNITY)
Admission: EM | Admit: 2023-05-12 | Discharge: 2023-05-12 | Disposition: A | Discharge status: Home / Self Care | Payer: No Typology Code available for payment source | Attending: Emergency Medicine | Admitting: Emergency Medicine

## 2023-05-12 ENCOUNTER — Emergency Department (HOSPITAL_COMMUNITY): Payer: No Typology Code available for payment source

## 2023-05-12 ENCOUNTER — Other Ambulatory Visit: Payer: Self-pay

## 2023-05-12 ENCOUNTER — Encounter (HOSPITAL_COMMUNITY): Payer: Self-pay

## 2023-05-12 DIAGNOSIS — Z7901 Long term (current) use of anticoagulants: Secondary | ICD-10-CM | POA: Diagnosis not present

## 2023-05-12 DIAGNOSIS — S22088A Other fracture of T11-T12 vertebra, initial encounter for closed fracture: Secondary | ICD-10-CM | POA: Diagnosis not present

## 2023-05-12 DIAGNOSIS — S22080A Wedge compression fracture of T11-T12 vertebra, initial encounter for closed fracture: Secondary | ICD-10-CM | POA: Diagnosis not present

## 2023-05-12 DIAGNOSIS — I6782 Cerebral ischemia: Secondary | ICD-10-CM | POA: Diagnosis not present

## 2023-05-12 DIAGNOSIS — Y92410 Unspecified street and highway as the place of occurrence of the external cause: Secondary | ICD-10-CM | POA: Diagnosis not present

## 2023-05-12 DIAGNOSIS — R079 Chest pain, unspecified: Secondary | ICD-10-CM | POA: Diagnosis not present

## 2023-05-12 DIAGNOSIS — M545 Low back pain, unspecified: Secondary | ICD-10-CM | POA: Diagnosis not present

## 2023-05-12 DIAGNOSIS — S0990XA Unspecified injury of head, initial encounter: Secondary | ICD-10-CM | POA: Diagnosis not present

## 2023-05-12 DIAGNOSIS — S22089A Unspecified fracture of T11-T12 vertebra, initial encounter for closed fracture: Secondary | ICD-10-CM

## 2023-05-12 DIAGNOSIS — M546 Pain in thoracic spine: Secondary | ICD-10-CM | POA: Diagnosis not present

## 2023-05-12 HISTORY — DX: Other pulmonary embolism without acute cor pulmonale: I26.99

## 2023-05-12 LAB — CBC WITH DIFFERENTIAL/PLATELET
Abs Immature Granulocytes: 0.07 10*3/uL (ref 0.00–0.07)
Basophils Absolute: 0 10*3/uL (ref 0.0–0.1)
Basophils Relative: 0 %
Eosinophils Absolute: 0 10*3/uL (ref 0.0–0.5)
Eosinophils Relative: 0 %
HCT: 41.5 % (ref 39.0–52.0)
Hemoglobin: 14.4 g/dL (ref 13.0–17.0)
Immature Granulocytes: 1 %
Lymphocytes Relative: 4 %
Lymphs Abs: 0.6 10*3/uL — ABNORMAL LOW (ref 0.7–4.0)
MCH: 31.4 pg (ref 26.0–34.0)
MCHC: 34.7 g/dL (ref 30.0–36.0)
MCV: 90.4 fL (ref 80.0–100.0)
Monocytes Absolute: 0.7 10*3/uL (ref 0.1–1.0)
Monocytes Relative: 5 %
Neutro Abs: 11.9 10*3/uL — ABNORMAL HIGH (ref 1.7–7.7)
Neutrophils Relative %: 90 %
Platelets: 179 10*3/uL (ref 150–400)
RBC: 4.59 MIL/uL (ref 4.22–5.81)
RDW: 12.5 % (ref 11.5–15.5)
WBC: 13.3 10*3/uL — ABNORMAL HIGH (ref 4.0–10.5)
nRBC: 0 % (ref 0.0–0.2)

## 2023-05-12 LAB — COMPREHENSIVE METABOLIC PANEL
ALT: 24 U/L (ref 0–44)
AST: 27 U/L (ref 15–41)
Albumin: 3.7 g/dL (ref 3.5–5.0)
Alkaline Phosphatase: 52 U/L (ref 38–126)
Anion gap: 6 (ref 5–15)
BUN: 24 mg/dL — ABNORMAL HIGH (ref 8–23)
CO2: 23 mmol/L (ref 22–32)
Calcium: 8.9 mg/dL (ref 8.9–10.3)
Chloride: 99 mmol/L (ref 98–111)
Creatinine, Ser: 1.16 mg/dL (ref 0.61–1.24)
GFR, Estimated: >60 mL/min (ref >60)
Glucose, Bld: 133 mg/dL — ABNORMAL HIGH (ref 70–99)
Potassium: 4.1 mmol/L (ref 3.5–5.1)
Sodium: 128 mmol/L — ABNORMAL LOW (ref 135–145)
Total Bilirubin: 0.7 mg/dL (ref 0.3–1.2)
Total Protein: 7.1 g/dL (ref 6.5–8.1)

## 2023-05-12 MED ORDER — ACETAMINOPHEN 325 MG PO TABS
650.0000 mg | ORAL_TABLET | Freq: Once | ORAL | Status: AC
  Administered 2023-05-12: 650 mg via ORAL
  Filled 2023-05-12: qty 2

## 2023-05-12 MED ORDER — TIZANIDINE HCL 2 MG PO CAPS: 2.0000 mg | ORAL_CAPSULE | Freq: Two times a day (BID) | ORAL | 0 refills | Status: DC | PRN

## 2023-05-12 MED ORDER — FENTANYL CITRATE PF 50 MCG/ML IJ SOSY
50.0000 ug | PREFILLED_SYRINGE | Freq: Once | INTRAMUSCULAR | Status: AC
  Administered 2023-05-12: 50 ug via INTRAVENOUS
  Filled 2023-05-12: qty 1

## 2023-05-12 MED ORDER — ONDANSETRON HCL 4 MG/2ML IJ SOLN
4.0000 mg | Freq: Once | INTRAMUSCULAR | Status: AC
  Administered 2023-05-12: 4 mg via INTRAVENOUS
  Filled 2023-05-12: qty 2

## 2023-05-12 MED ORDER — OXYCODONE-ACETAMINOPHEN 5-325 MG PO TABS: 1.0000 | ORAL_TABLET | Freq: Three times a day (TID) | ORAL | 0 refills | Status: DC | PRN

## 2023-05-12 MED ORDER — OXYCODONE HCL 5 MG PO TABS
5.0000 mg | ORAL_TABLET | Freq: Once | ORAL | Status: AC
  Administered 2023-05-12: 5 mg via ORAL
  Filled 2023-05-12: qty 1

## 2023-05-12 MED ORDER — ACETAMINOPHEN 500 MG PO TABS: 500.0000 mg | ORAL_TABLET | ORAL | 0 refills | Status: AC | PRN

## 2023-05-12 MED ORDER — ONDANSETRON 4 MG PO TBDP
4.0000 mg | ORAL_TABLET | Freq: Once | ORAL | Status: AC
  Administered 2023-05-12: 4 mg via ORAL
  Filled 2023-05-12: qty 1
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
## 2023-05-12 NOTE — ED Notes (Signed)
Patient transported to CT 
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
## 2023-05-17 ENCOUNTER — Telehealth: Payer: Self-pay | Admitting: Orthopedic Surgery
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
## 2023-05-17 NOTE — Telephone Encounter (Signed)
He should wear the brace when he is up and moving around, correct? If he is just sitting can he take this off?
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
## 2023-05-17 NOTE — Telephone Encounter (Signed)
He should wear brace when up and walking. Can take off to sleep. Can take off if sitting and watching TV, but put back on when he gets up.

## 2023-05-17 NOTE — Telephone Encounter (Signed)
Spoke with Marcelino Duster (DIL) and let her know that a smaller or "simpler" brace would not do what is needed. She said she understood and would work with him on taking the brace on and off as needed

## 2023-05-26 DIAGNOSIS — S22089D Unspecified fracture of T11-T12 vertebra, subsequent encounter for fracture with routine healing: Secondary | ICD-10-CM | POA: Diagnosis not present

## 2023-05-26 DIAGNOSIS — I10 Essential (primary) hypertension: Secondary | ICD-10-CM | POA: Diagnosis not present

## 2023-05-26 DIAGNOSIS — R600 Localized edema: Secondary | ICD-10-CM | POA: Diagnosis not present

## 2023-05-26 DIAGNOSIS — Z713 Dietary counseling and surveillance: Secondary | ICD-10-CM | POA: Diagnosis not present

## 2023-05-26 DIAGNOSIS — Z79899 Other long term (current) drug therapy: Secondary | ICD-10-CM | POA: Diagnosis not present

## 2023-05-28 ENCOUNTER — Other Ambulatory Visit: Payer: Self-pay | Admitting: Orthopedic Surgery

## 2023-05-28 DIAGNOSIS — S22089A Unspecified fracture of T11-T12 vertebra, initial encounter for closed fracture: Secondary | ICD-10-CM
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
## 2023-06-03 ENCOUNTER — Ambulatory Visit (INDEPENDENT_AMBULATORY_CARE_PROVIDER_SITE_OTHER): Payer: Medicare Other | Admitting: Orthopedic Surgery

## 2023-06-03 ENCOUNTER — Ambulatory Visit
Admission: RE | Admit: 2023-06-03 | Discharge: 2023-06-03 | Disposition: A | Discharge status: Home / Self Care | Payer: Medicare Other | Source: Ambulatory Visit | Attending: Orthopedic Surgery | Admitting: Orthopedic Surgery

## 2023-06-03 ENCOUNTER — Encounter: Payer: Self-pay | Admitting: Orthopedic Surgery

## 2023-06-03 ENCOUNTER — Ambulatory Visit
Admission: RE | Admit: 2023-06-03 | Discharge: 2023-06-03 | Disposition: A | Discharge status: Home / Self Care | Payer: Medicare Other | Attending: Orthopedic Surgery | Admitting: Orthopedic Surgery

## 2023-06-03 VITALS — BP 136/78 | Ht 67.0 in | Wt 178.0 lb

## 2023-06-03 DIAGNOSIS — S22089A Unspecified fracture of T11-T12 vertebra, initial encounter for closed fracture: Secondary | ICD-10-CM | POA: Diagnosis not present

## 2023-06-03 DIAGNOSIS — M545 Low back pain, unspecified: Secondary | ICD-10-CM | POA: Diagnosis not present

## 2023-06-03 MED ORDER — TIZANIDINE HCL 2 MG PO CAPS
2.0000 mg | ORAL_CAPSULE | Freq: Two times a day (BID) | ORAL | 0 refills | Status: DC | PRN
Start: 2023-06-03 — End: 2023-07-21
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
## 2023-07-16 ENCOUNTER — Other Ambulatory Visit: Payer: Self-pay | Admitting: Orthopedic Surgery

## 2023-07-16 DIAGNOSIS — S22089A Unspecified fracture of T11-T12 vertebra, initial encounter for closed fracture: Secondary | ICD-10-CM
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
## 2023-07-19 DIAGNOSIS — R972 Elevated prostate specific antigen [PSA]: Secondary | ICD-10-CM | POA: Diagnosis not present

## 2023-07-19 DIAGNOSIS — E782 Mixed hyperlipidemia: Secondary | ICD-10-CM | POA: Diagnosis not present

## 2023-07-19 DIAGNOSIS — R7301 Impaired fasting glucose: Secondary | ICD-10-CM | POA: Diagnosis not present

## 2023-07-19 DIAGNOSIS — E039 Hypothyroidism, unspecified: Secondary | ICD-10-CM | POA: Diagnosis not present

## 2023-07-21 ENCOUNTER — Ambulatory Visit
Admission: RE | Admit: 2023-07-21 | Discharge: 2023-07-21 | Disposition: A | Discharge status: Home / Self Care | Payer: Medicare Other | Attending: Orthopedic Surgery | Admitting: Orthopedic Surgery

## 2023-07-21 ENCOUNTER — Encounter: Payer: Self-pay | Admitting: Orthopedic Surgery

## 2023-07-21 ENCOUNTER — Ambulatory Visit
Admission: RE | Admit: 2023-07-21 | Discharge: 2023-07-21 | Disposition: A | Discharge status: Home / Self Care | Payer: Medicare Other | Source: Ambulatory Visit | Attending: Orthopedic Surgery | Admitting: Orthopedic Surgery

## 2023-07-21 ENCOUNTER — Ambulatory Visit (INDEPENDENT_AMBULATORY_CARE_PROVIDER_SITE_OTHER): Payer: Medicare Other | Admitting: Orthopedic Surgery

## 2023-07-21 VITALS — BP 122/84 | Wt 156.4 lb

## 2023-07-21 DIAGNOSIS — S22089A Unspecified fracture of T11-T12 vertebra, initial encounter for closed fracture: Secondary | ICD-10-CM

## 2023-07-21 DIAGNOSIS — M4810 Ankylosing hyperostosis [Forestier], site unspecified: Secondary | ICD-10-CM | POA: Diagnosis not present

## 2023-07-21 DIAGNOSIS — S22089D Unspecified fracture of T11-T12 vertebra, subsequent encounter for fracture with routine healing: Secondary | ICD-10-CM | POA: Diagnosis not present

## 2023-07-21 DIAGNOSIS — M4854XA Collapsed vertebra, not elsewhere classified, thoracic region, initial encounter for fracture: Secondary | ICD-10-CM | POA: Diagnosis not present

## 2023-07-21 DIAGNOSIS — I700 Atherosclerosis of aorta: Secondary | ICD-10-CM | POA: Diagnosis not present

## 2023-07-23 DIAGNOSIS — I10 Essential (primary) hypertension: Secondary | ICD-10-CM | POA: Diagnosis not present

## 2023-07-23 DIAGNOSIS — S22089D Unspecified fracture of T11-T12 vertebra, subsequent encounter for fracture with routine healing: Secondary | ICD-10-CM | POA: Diagnosis not present

## 2023-07-23 DIAGNOSIS — R600 Localized edema: Secondary | ICD-10-CM | POA: Diagnosis not present

## 2023-07-23 DIAGNOSIS — N1831 Chronic kidney disease, stage 3a: Secondary | ICD-10-CM | POA: Diagnosis not present

## 2023-07-23 DIAGNOSIS — I129 Hypertensive chronic kidney disease with stage 1 through stage 4 chronic kidney disease, or unspecified chronic kidney disease: Secondary | ICD-10-CM | POA: Diagnosis not present

## 2023-07-23 DIAGNOSIS — R7301 Impaired fasting glucose: Secondary | ICD-10-CM | POA: Diagnosis not present

## 2023-07-23 DIAGNOSIS — E782 Mixed hyperlipidemia: Secondary | ICD-10-CM | POA: Diagnosis not present

## 2023-07-23 DIAGNOSIS — J302 Other seasonal allergic rhinitis: Secondary | ICD-10-CM | POA: Diagnosis not present

## 2023-07-23 DIAGNOSIS — E039 Hypothyroidism, unspecified: Secondary | ICD-10-CM | POA: Diagnosis not present

## 2023-07-23 DIAGNOSIS — E785 Hyperlipidemia, unspecified: Secondary | ICD-10-CM | POA: Diagnosis not present

## 2023-07-23 DIAGNOSIS — E875 Hyperkalemia: Secondary | ICD-10-CM | POA: Diagnosis not present
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
## 2023-08-19 ENCOUNTER — Ambulatory Visit (INDEPENDENT_AMBULATORY_CARE_PROVIDER_SITE_OTHER): Payer: Medicare Other | Admitting: Orthopedic Surgery

## 2023-08-19 ENCOUNTER — Encounter: Payer: Self-pay | Admitting: Orthopedic Surgery

## 2023-08-19 ENCOUNTER — Ambulatory Visit
Admission: RE | Admit: 2023-08-19 | Discharge: 2023-08-19 | Disposition: A | Discharge status: Home / Self Care | Payer: Medicare Other | Attending: Orthopedic Surgery | Admitting: Orthopedic Surgery

## 2023-08-19 ENCOUNTER — Ambulatory Visit
Admission: RE | Admit: 2023-08-19 | Discharge: 2023-08-19 | Disposition: A | Discharge status: Home / Self Care | Payer: Medicare Other | Source: Ambulatory Visit | Attending: Orthopedic Surgery | Admitting: Orthopedic Surgery

## 2023-08-19 VITALS — BP 140/82 | Ht 67.0 in | Wt 156.0 lb

## 2023-08-19 DIAGNOSIS — M4810 Ankylosing hyperostosis [Forestier], site unspecified: Secondary | ICD-10-CM | POA: Diagnosis not present

## 2023-08-19 DIAGNOSIS — S22089D Unspecified fracture of T11-T12 vertebra, subsequent encounter for fracture with routine healing: Secondary | ICD-10-CM | POA: Diagnosis present

## 2023-08-19 DIAGNOSIS — M4854XA Collapsed vertebra, not elsewhere classified, thoracic region, initial encounter for fracture: Secondary | ICD-10-CM | POA: Diagnosis not present

## 2023-08-19 DIAGNOSIS — M47816 Spondylosis without myelopathy or radiculopathy, lumbar region: Secondary | ICD-10-CM | POA: Diagnosis not present

## 2023-09-18 DIAGNOSIS — Z23 Encounter for immunization: Secondary | ICD-10-CM | POA: Diagnosis not present
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
## 2023-11-17 ENCOUNTER — Ambulatory Visit: Payer: Medicare Other | Admitting: Orthopedic Surgery

## 2023-11-17 ENCOUNTER — Ambulatory Visit
Admission: RE | Admit: 2023-11-17 | Discharge: 2023-11-17 | Disposition: A | Discharge status: Home / Self Care | Payer: Medicare Other | Source: Ambulatory Visit | Attending: Orthopedic Surgery | Admitting: Orthopedic Surgery

## 2023-11-17 ENCOUNTER — Ambulatory Visit
Admission: RE | Admit: 2023-11-17 | Discharge: 2023-11-17 | Disposition: A | Discharge status: Home / Self Care | Payer: Medicare Other | Attending: Orthopedic Surgery | Admitting: Orthopedic Surgery

## 2023-11-17 ENCOUNTER — Encounter: Payer: Self-pay | Admitting: Orthopedic Surgery

## 2023-11-17 VITALS — BP 132/80 | Ht 67.0 in | Wt 156.0 lb

## 2023-11-17 DIAGNOSIS — I700 Atherosclerosis of aorta: Secondary | ICD-10-CM | POA: Diagnosis not present

## 2023-11-17 DIAGNOSIS — M2578 Osteophyte, vertebrae: Secondary | ICD-10-CM | POA: Diagnosis not present

## 2023-11-17 DIAGNOSIS — M545 Low back pain, unspecified: Secondary | ICD-10-CM | POA: Diagnosis not present

## 2023-11-17 DIAGNOSIS — S22089D Unspecified fracture of T11-T12 vertebra, subsequent encounter for fracture with routine healing: Secondary | ICD-10-CM

## 2023-11-17 DIAGNOSIS — M40209 Unspecified kyphosis, site unspecified: Secondary | ICD-10-CM

## 2023-11-17 DIAGNOSIS — M40204 Unspecified kyphosis, thoracic region: Secondary | ICD-10-CM | POA: Diagnosis not present

## 2023-11-18 DIAGNOSIS — R972 Elevated prostate specific antigen [PSA]: Secondary | ICD-10-CM | POA: Diagnosis not present

## 2023-11-18 DIAGNOSIS — R7301 Impaired fasting glucose: Secondary | ICD-10-CM | POA: Diagnosis not present

## 2023-11-18 DIAGNOSIS — E782 Mixed hyperlipidemia: Secondary | ICD-10-CM | POA: Diagnosis not present

## 2023-11-18 DIAGNOSIS — E039 Hypothyroidism, unspecified: Secondary | ICD-10-CM | POA: Diagnosis not present

## 2023-11-23 DIAGNOSIS — R600 Localized edema: Secondary | ICD-10-CM | POA: Diagnosis not present

## 2023-11-23 DIAGNOSIS — J302 Other seasonal allergic rhinitis: Secondary | ICD-10-CM | POA: Diagnosis not present

## 2023-11-23 DIAGNOSIS — R972 Elevated prostate specific antigen [PSA]: Secondary | ICD-10-CM | POA: Diagnosis not present

## 2023-11-23 DIAGNOSIS — E782 Mixed hyperlipidemia: Secondary | ICD-10-CM | POA: Diagnosis not present

## 2023-11-23 DIAGNOSIS — I129 Hypertensive chronic kidney disease with stage 1 through stage 4 chronic kidney disease, or unspecified chronic kidney disease: Secondary | ICD-10-CM | POA: Diagnosis not present

## 2023-11-23 DIAGNOSIS — R7301 Impaired fasting glucose: Secondary | ICD-10-CM | POA: Diagnosis not present

## 2023-11-23 DIAGNOSIS — N1831 Chronic kidney disease, stage 3a: Secondary | ICD-10-CM | POA: Diagnosis not present

## 2023-11-23 DIAGNOSIS — E875 Hyperkalemia: Secondary | ICD-10-CM | POA: Diagnosis not present

## 2023-11-23 DIAGNOSIS — E039 Hypothyroidism, unspecified: Secondary | ICD-10-CM | POA: Diagnosis not present

## 2023-11-23 DIAGNOSIS — S22089D Unspecified fracture of T11-T12 vertebra, subsequent encounter for fracture with routine healing: Secondary | ICD-10-CM | POA: Diagnosis not present

## 2023-11-23 DIAGNOSIS — Z86711 Personal history of pulmonary embolism: Secondary | ICD-10-CM | POA: Diagnosis not present

## 2023-11-23 DIAGNOSIS — I10 Essential (primary) hypertension: Secondary | ICD-10-CM | POA: Diagnosis not present

## 2024-03-16 DIAGNOSIS — R7301 Impaired fasting glucose: Secondary | ICD-10-CM | POA: Diagnosis not present

## 2024-03-16 DIAGNOSIS — E782 Mixed hyperlipidemia: Secondary | ICD-10-CM | POA: Diagnosis not present

## 2024-03-16 DIAGNOSIS — E039 Hypothyroidism, unspecified: Secondary | ICD-10-CM | POA: Diagnosis not present

## 2024-03-23 DIAGNOSIS — N1831 Chronic kidney disease, stage 3a: Secondary | ICD-10-CM | POA: Diagnosis not present

## 2024-03-23 DIAGNOSIS — R600 Localized edema: Secondary | ICD-10-CM | POA: Diagnosis not present

## 2024-03-23 DIAGNOSIS — J302 Other seasonal allergic rhinitis: Secondary | ICD-10-CM | POA: Diagnosis not present

## 2024-03-23 DIAGNOSIS — I700 Atherosclerosis of aorta: Secondary | ICD-10-CM | POA: Diagnosis not present

## 2024-03-23 DIAGNOSIS — N401 Enlarged prostate with lower urinary tract symptoms: Secondary | ICD-10-CM | POA: Diagnosis not present

## 2024-03-23 DIAGNOSIS — I129 Hypertensive chronic kidney disease with stage 1 through stage 4 chronic kidney disease, or unspecified chronic kidney disease: Secondary | ICD-10-CM | POA: Diagnosis not present

## 2024-03-23 DIAGNOSIS — E785 Hyperlipidemia, unspecified: Secondary | ICD-10-CM | POA: Diagnosis not present

## 2024-03-23 DIAGNOSIS — I10 Essential (primary) hypertension: Secondary | ICD-10-CM | POA: Diagnosis not present

## 2024-03-23 DIAGNOSIS — R7301 Impaired fasting glucose: Secondary | ICD-10-CM | POA: Diagnosis not present

## 2024-03-23 DIAGNOSIS — R972 Elevated prostate specific antigen [PSA]: Secondary | ICD-10-CM | POA: Diagnosis not present

## 2024-03-23 DIAGNOSIS — E039 Hypothyroidism, unspecified: Secondary | ICD-10-CM | POA: Diagnosis not present

## 2024-03-23 DIAGNOSIS — E782 Mixed hyperlipidemia: Secondary | ICD-10-CM | POA: Diagnosis not present

## 2024-07-17 ENCOUNTER — Telehealth: Payer: Self-pay | Admitting: Orthopedic Surgery

## 2024-07-17 NOTE — Telephone Encounter (Signed)
 I spoke to patient's wife and informed her of message below. She states she will have to talk to him and will call back if she decides to make a follow up appt.

## 2024-07-17 NOTE — Telephone Encounter (Signed)
 I would need to see him in office for evaluation since I haven't seen him since November. He may want to try asking his PCP as well.

## 2024-07-17 NOTE — Telephone Encounter (Signed)
 Patient's wife called to see if [facility] could write a letter of medical necessity that the patient needed to have a lift chair in order to be able to get up and down out of the chair and that he also needed a walk in shower in order to be able to bathe as he was not able to get in or out of the bathtub. The patient's wife requested a call back from [facility].

## 2024-07-18 NOTE — Telephone Encounter (Signed)
 Patient's daughter in law, Jacob Bauer is calling back to clarify that the letter of medical necessity is from when the patient was seen back in November and at that time to present he needed the life chair in order to get up and down and the walk in shower in order to bathe. She states that she is happy to bring him in for any appointment, but wanted to be sure that her mother in law was clear in what she was needing for their attorney. Please see below message.   If an appointment is needed, call [facility] [phone]     Media Information  Document Information  AMB Correspondence  NEUROSURGERY ANSWERING SERVICE CALL  07/18/2024 11:09  Attached To:  Jacob Bauer  Source Information  Default, Provider, MD

## 2024-07-18 NOTE — Telephone Encounter (Signed)
 Michelle notified and will contact our office if they need to schedule an appointment.

## 2024-07-18 NOTE — Telephone Encounter (Signed)
 I have sent a letter to his MyChart. If he needs anything more than this, he will need to see me in the office.   Thanks!

## 2024-09-19 DIAGNOSIS — R7301 Impaired fasting glucose: Secondary | ICD-10-CM | POA: Diagnosis not present

## 2024-09-19 DIAGNOSIS — E039 Hypothyroidism, unspecified: Secondary | ICD-10-CM | POA: Diagnosis not present

## 2024-09-19 DIAGNOSIS — E782 Mixed hyperlipidemia: Secondary | ICD-10-CM | POA: Diagnosis not present

## 2024-09-25 DIAGNOSIS — R600 Localized edema: Secondary | ICD-10-CM | POA: Diagnosis not present

## 2024-09-25 DIAGNOSIS — E039 Hypothyroidism, unspecified: Secondary | ICD-10-CM | POA: Diagnosis not present

## 2024-09-25 DIAGNOSIS — R7301 Impaired fasting glucose: Secondary | ICD-10-CM | POA: Diagnosis not present

## 2024-09-25 DIAGNOSIS — E782 Mixed hyperlipidemia: Secondary | ICD-10-CM | POA: Diagnosis not present

## 2024-09-25 DIAGNOSIS — J302 Other seasonal allergic rhinitis: Secondary | ICD-10-CM | POA: Diagnosis not present

## 2024-09-25 DIAGNOSIS — I129 Hypertensive chronic kidney disease with stage 1 through stage 4 chronic kidney disease, or unspecified chronic kidney disease: Secondary | ICD-10-CM | POA: Diagnosis not present

## 2024-09-25 DIAGNOSIS — I10 Essential (primary) hypertension: Secondary | ICD-10-CM | POA: Diagnosis not present

## 2024-09-25 DIAGNOSIS — R972 Elevated prostate specific antigen [PSA]: Secondary | ICD-10-CM | POA: Diagnosis not present

## 2024-09-25 DIAGNOSIS — N1831 Chronic kidney disease, stage 3a: Secondary | ICD-10-CM | POA: Diagnosis not present

## 2024-09-25 DIAGNOSIS — Z23 Encounter for immunization: Secondary | ICD-10-CM | POA: Diagnosis not present

## 2024-09-25 DIAGNOSIS — Z86711 Personal history of pulmonary embolism: Secondary | ICD-10-CM | POA: Diagnosis not present

## 2024-09-25 DIAGNOSIS — E875 Hyperkalemia: Secondary | ICD-10-CM | POA: Diagnosis not present

## 2024-10-19 DIAGNOSIS — X32XXXD Exposure to sunlight, subsequent encounter: Secondary | ICD-10-CM | POA: Diagnosis not present

## 2024-10-19 DIAGNOSIS — D225 Melanocytic nevi of trunk: Secondary | ICD-10-CM | POA: Diagnosis not present

## 2024-10-19 DIAGNOSIS — L570 Actinic keratosis: Secondary | ICD-10-CM | POA: Diagnosis not present
